# Patient Record
Sex: Female | Born: 1995 | Race: Black or African American | Hispanic: No | Marital: Single | Smoking: Never smoker
Health system: Southern US, Community
[De-identification: ages and names within clinical notes are randomized; demographics above are authoritative.]

## PROBLEM LIST (undated history)

## (undated) DIAGNOSIS — N649 Disorder of breast, unspecified: Secondary | ICD-10-CM

## (undated) HISTORY — PX: NO PAST SURGERIES: SHX2092

## (undated) HISTORY — PX: BREAST SURGERY: SHX581

## (undated) HISTORY — DX: Disorder of breast, unspecified: N64.9

---

## 2020-06-16 ENCOUNTER — Ambulatory Visit: Payer: Self-pay

## 2021-03-10 ENCOUNTER — Ambulatory Visit: Payer: Medicaid Other

## 2021-03-22 ENCOUNTER — Ambulatory Visit: Payer: Medicaid Other

## 2021-04-22 ENCOUNTER — Encounter: Payer: Self-pay | Admitting: Physician Assistant

## 2021-04-22 ENCOUNTER — Ambulatory Visit: Payer: Medicaid Other

## 2021-04-22 ENCOUNTER — Other Ambulatory Visit: Payer: Self-pay

## 2021-04-22 ENCOUNTER — Ambulatory Visit (LOCAL_COMMUNITY_HEALTH_CENTER): Payer: Medicaid Other | Admitting: Physician Assistant

## 2021-04-22 VITALS — BP 110/67 | Ht 66.0 in | Wt 219.4 lb

## 2021-04-22 DIAGNOSIS — Z01419 Encounter for gynecological examination (general) (routine) without abnormal findings: Secondary | ICD-10-CM

## 2021-04-22 DIAGNOSIS — Z3009 Encounter for other general counseling and advice on contraception: Secondary | ICD-10-CM

## 2021-04-22 DIAGNOSIS — N6312 Unspecified lump in the right breast, upper inner quadrant: Secondary | ICD-10-CM

## 2021-04-22 NOTE — Progress Notes (Addendum)
Here today for PE. Patient states last PE was 2-3 years ago in [place]. No records in Epic. Unsure if she has had a Pap Smear. Not interested in birth control. Hal Morales, RN

## 2021-04-22 NOTE — Progress Notes (Signed)
[facility] [place] Number: [phone]    Family Planning Visit- Initial Visit  Subjective:  Denise Gates is a 25 y.o.  [id number]   being seen today for an initial annual visit and to discuss contraceptive options.  The patient is currently using Abstinence for pregnancy prevention. Patient reports she does not want a pregnancy in the next year.  Patient has the following medical conditions has Breast lump on right side at 2 o'clock position on their problem list.  Chief Complaint  Patient presents with  . Gynecologic Exam    Patient reports R breast lump for at least 3-6 mo, had eval by urgent care 2-3 mo ago, but no imaging. Was told it might be fibrocytstic/hormonally-related. Wants reassessment today.   Patient denies h/o breast surgery, injury. Has never been sexually active, or used drugs. Rare remote alcohol use.  Body mass index is 35.41 kg/m. - Patient is eligible for diabetes screening based on BMI and age >76?  no HA1C ordered? not applicable  Patient reports 0  partner/s in last year. Desires STI screening?  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  No  No results found for: HCVAB Pt desires HCV screen.  Does the patient have current drug use (including MJ), have a partner with drug use, and/or has been incarcerated since last result? No  If yes-- Screen for HCV through [facility]   Does the patient meet criteria for HBV testing? No  Criteria:  -Household, sexual or needle sharing contact with HBV -History of drug use -HIV positive -Those with known Hep C   Health Maintenance Due  Topic Date Due  . COVID-19 Vaccine (1) Never done  . HPV VACCINES (1 - 2-dose series) Never done  . HIV Screening  Never done  . Hepatitis C Screening  Never done  . TETANUS/TDAP  Never  done  . PAP-Cervical Cytology Screening  Never done  . PAP SMEAR-Modifier  Never done    Review of Systems  Constitutional: Negative.   HENT: Negative.   Eyes: Negative.   Respiratory: Negative.   Cardiovascular: Negative.   Gastrointestinal: Negative.   Genitourinary: Negative.   Musculoskeletal: Negative.   Skin: Negative.   Neurological: Negative.   Endo/Heme/Allergies: Negative.   Psychiatric/Behavioral: Negative.   All other systems reviewed and are negative.   The following portions of the patient's history were reviewed and updated as appropriate: allergies, current medications, past family history, past medical history, past social history, past surgical history and problem list. Problem list updated.   See flowsheet for other program required questions.  Objective:   Vitals:   04/22/21 0910  BP: 110/67  Weight: 219 lb 6.4 oz (99.5 kg)  Height: 5\' 6"  (1.676 m)    Physical Exam Constitutional:      Appearance: Normal appearance. She is obese.  HENT:     Head: Normocephalic and atraumatic.  Pulmonary:     Effort: Pulmonary effort is normal.  Chest:     Chest wall: No mass, deformity, swelling or tenderness.  Breasts:     Tanner Score is 5.     Right: Mass present. No inverted nipple, nipple discharge, skin change, tenderness, axillary adenopathy or supraclavicular adenopathy.     Left: Normal. No inverted nipple, mass, nipple discharge, skin change, tenderness, axillary  adenopathy or supraclavicular adenopathy.      Comments: R breast mobile firm nontender 1x2cm lump at 2 o'clock Abdominal:     Palpations: Abdomen is soft.  Genitourinary:    General: Normal vulva.     Exam position: Lithotomy position.     Pubic Area: No rash.      Labia:        Right: No tenderness or lesion.        Left: No tenderness or lesion.      Vagina: No vaginal discharge, erythema or tenderness.     Cervix: No cervical motion tenderness, discharge, lesion or erythema.      Uterus: Not enlarged and not tender.      Adnexa:        Right: No tenderness or fullness.         Left: No tenderness or fullness.       Rectum: No external hemorrhoid.  Musculoskeletal:        General: Normal range of motion.  Lymphadenopathy:     Upper Body:     Right upper body: No supraclavicular, axillary or pectoral adenopathy.     Left upper body: No supraclavicular, axillary or pectoral adenopathy.     Lower Body: No right inguinal adenopathy. No left inguinal adenopathy.  Skin:    General: Skin is warm and dry.  Neurological:     General: No focal deficit present.     Mental Status: She is alert.  Psychiatric:        Mood and Affect: Mood normal.        Behavior: Behavior normal.       Assessment and Plan:  Denise Gates is a 25 y.o. female presenting to the [facility] Department for an initial annual wellness/contraceptive visit  Contraception counseling: Reviewed all forms of birth control options in the tiered based approach. available including abstinence; over the counter/barrier methods; hormonal contraceptive medication including pill, patch, ring, injection,contraceptive implant, ECP; hormonal and nonhormonal IUDs; permanent sterilization options including vasectomy and the various tubal sterilization modalities. Risks, benefits, and typical effectiveness rates were reviewed.  Questions were answered.  Written information was also given to the patient to review.  Patient desires no method. She will follow up in  12 mo for surveillance.  She was told to call with any further questions, or with any concerns about this method of contraception.  Emphasized use of condoms 100% of the time for STI prevention.   1. Family planning services Pt plans to continue abstinence.  2. Well woman exam with routine gynecological exam Discussed option for COCPs for noncontraceptive benefit to decrease menstrual pain, pt is considering. Enc pt to establish primary  care provider. - Syphilis Serology, [facility] - HIV/HCV [facility] - Pap IG (Image Guided)  3. Breast lump on right side at 2 o'clock position BCCCP referral, may need ultrasound for further eval.  4. BMI elevated at 35.4 Enc diet/exercise.  Return in about 1 year (around 04/22/2022) for Annual well-woman exam.  Future Appointments  Date Time Provider [place]  04/28/2021 12:30 PM [facility] CLINIC [facility] None    Lora Havens, PA-C

## 2021-04-23 ENCOUNTER — Encounter: Payer: Self-pay | Admitting: Family Medicine

## 2021-04-23 NOTE — Progress Notes (Signed)
Received referral to [facility] from A. Streilein PA-C as client with several month history of right breast lump in 2 o'clock position. Per Epic appt desk, client has previously scheduled [facility] appt for same reason as above on referral from [facility]. Ms. Ronette Deter aware of above and [facility] referral not faxed. Rich Number, RN

## 2021-04-28 ENCOUNTER — Ambulatory Visit
Admission: RE | Admit: 2021-04-28 | Discharge: 2021-04-28 | Disposition: A | Discharge status: Home / Self Care | Payer: Self-pay | Source: Ambulatory Visit | Attending: Oncology | Admitting: Oncology

## 2021-04-28 ENCOUNTER — Other Ambulatory Visit: Payer: Self-pay

## 2021-04-28 ENCOUNTER — Ambulatory Visit: Payer: Medicaid Other | Attending: Oncology

## 2021-04-28 VITALS — BP 123/70 | HR 51 | Temp 97.6°F | Ht 67.5 in | Wt 219.0 lb

## 2021-04-28 DIAGNOSIS — N63 Unspecified lump in unspecified breast: Secondary | ICD-10-CM

## 2021-04-28 LAB — PAP IG (IMAGE GUIDED): PAP Smear Comment: 0

## 2021-04-28 LAB — HM HIV SCREENING LAB: HM HIV Screening: NEGATIVE

## 2021-04-28 LAB — HM HEPATITIS C SCREENING LAB: HM Hepatitis Screen: NEGATIVE

## 2021-04-28 NOTE — Addendum Note (Signed)
Addended byTheodore Demark on: 04/28/2021 02:01 PM   Modules accepted: Orders

## 2021-04-28 NOTE — Progress Notes (Signed)
  Subjective:     Patient ID: Denise Gates, female   DOB: 05-15-1996, 25 y.o.   MRN: [id number]  HPI   [facility] Medical History Record - 04/28/21 1246      Breast History   Screening cycle New    CBE Date 04/20/21    Provider (CBE) [facility]    Last Mammogram Never    Recent Breast Symptoms Lump   present 5-6 months     Breast Cancer History   Breast Cancer History No personal or family history      Previous History of Breast Problems   Breast Surgery or Biopsy None    Breast Implants N/A    BSE Done Monthly      Gynecological/Obstetrical History   LMP 04/19/21    Is there any chance that the client could be pregnant?  No    Age at menarche 88    Age at menopause NA    PAP smear history Annually    Date of last PAP  04/22/21    Provider (PAP) [facility]    DES Exposure Unkown    Cervical, Uterine or Ovarian cancer No    Family history of Cervial, Uterine or Ovarian cancer No    Hysterectomy No    Cervix removed No    Ovaries removed No    Laser/Cryosurgery No    Current method of birth control None    Current method of Estrogen/Hormone replacement None    Smoking history None             Review of Systems     Objective:   Physical Exam Chest:  Breasts:     Right: Mass present. No swelling, bleeding, inverted nipple, nipple discharge, skin change or tenderness.     Left: No swelling, bleeding, inverted nipple, mass, nipple discharge, skin change or tenderness.           Assessment:     25 year old patient presents for [facility] clinic visit. Reports she has a right breast lump x 1 year.  States she was seen in an Urgent care, last Spring and told she had fibrocystic breasts.   Patient screened, and meets BCCCP eligibility.  Patient does not have insurance, Medicare or Medicaid. Instructed patient on breast self awareness using teach back method.  Clinical breast exam reveals a firm mobile 2 cm mass at 2 o'clock right breast.  Mass is also  visible on visual inspection.  Explained BCCCP guidelines to patient.  Will follow per radiologist recommendation, and BCCCP guidelines.     Plan:     Sent for left breast ultrasound.

## 2021-04-29 ENCOUNTER — Other Ambulatory Visit: Payer: Self-pay | Admitting: *Deleted

## 2021-04-29 DIAGNOSIS — N63 Unspecified lump in unspecified breast: Secondary | ICD-10-CM

## 2021-05-11 ENCOUNTER — Ambulatory Visit: Payer: Medicaid Other

## 2021-05-19 ENCOUNTER — Ambulatory Visit
Admission: RE | Admit: 2021-05-19 | Discharge: 2021-05-19 | Disposition: A | Discharge status: Home / Self Care | Payer: Self-pay | Source: Ambulatory Visit | Attending: Oncology | Admitting: Oncology

## 2021-05-19 ENCOUNTER — Other Ambulatory Visit: Payer: Self-pay

## 2021-05-19 DIAGNOSIS — N63 Unspecified lump in unspecified breast: Secondary | ICD-10-CM

## 2021-05-20 LAB — SURGICAL PATHOLOGY

## 2021-05-20 NOTE — Progress Notes (Signed)
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  Radiologist recommended surgical consult to discuss excision with patient.  Explained to patient that BCCCP will pay for consult, but may not cover surgery entirely.

## 2021-05-25 ENCOUNTER — Other Ambulatory Visit: Payer: Self-pay

## 2021-05-25 ENCOUNTER — Ambulatory Visit (INDEPENDENT_AMBULATORY_CARE_PROVIDER_SITE_OTHER): Payer: Self-pay | Admitting: General Surgery

## 2021-05-25 ENCOUNTER — Encounter: Payer: Self-pay | Admitting: General Surgery

## 2021-05-25 VITALS — BP 110/73 | HR 83 | Temp 99.1°F | Ht 67.0 in | Wt 218.0 lb

## 2021-05-25 DIAGNOSIS — N6312 Unspecified lump in the right breast, upper inner quadrant: Secondary | ICD-10-CM

## 2021-05-25 NOTE — Progress Notes (Signed)
Patient ID: Denise Gates, female   DOB: [birth year], 25 y.o.   MRN: [id number]  Chief Complaint  Patient presents with   New Patient (Initial Visit)    Right breast mass    HPI Denise Gates is a 25 y.o. female.   About 1 year ago, she self diagnosed a lump in her right breast.  She went to an urgent care and was told it was likely just fibrocystic change or hormonally related.  It seemed to get bigger over the subsequent months and she ultimately made arrangements for imaging.  This was done on April 28, 2021.  Recommendation was made for biopsy, which was then performed on June 22.  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  [name] reports menarche at the age of 51.  No pregnancies and no use of birth control or hormonal therapy.  She denies any skin changes or nipple discharge.  She does perform monthly breast exams, which is how she identified to the lump.  No family history of breast cancer.   Past Medical History:  Diagnosis Date   Breast disorder    lump on right breast    Past Surgical History:  Procedure Laterality Date   NO PAST SURGERIES      Family History  Problem Relation Age of Onset   Endometriosis Mother     Social History Social History   Tobacco Use   Smoking status: Never   Smokeless tobacco: Never  Substance Use Topics   Alcohol use: Never   Drug use: Never    No Known Allergies  No current outpatient medications on file.   No current facility-administered medications for this visit.    Review of Systems Review of Systems  All other systems reviewed and are negative.  Blood pressure 110/73, pulse 83, temperature 99.1 F (37.3 C), temperature source Oral, height 5\' 7"  (1.702 m), weight 218 lb (98.9 kg), last menstrual period 04/29/2021, SpO2 98 %. Body mass index is 34.14 kg/m.  Physical Exam Physical Exam Vitals reviewed. Exam  conducted with a chaperone present.  Constitutional:      General: She is not in acute distress.    Appearance: Normal appearance. She is obese.  HENT:     Head: Normocephalic and atraumatic.     Nose:     Comments: Covered with a mask    Mouth/Throat:     Comments: Covered with a mask Eyes:     General: No scleral icterus.       Right eye: No discharge.        Left eye: No discharge.  Neck:     Comments: No palpable cervical or supraclavicular lymphadenopathy.  The trachea is midline.  No thyromegaly or dominant thyroid masses appreciated.  The gland moves freely with deglutition. Cardiovascular:     Rate and Rhythm: Normal rate and regular rhythm.     Pulses: Normal pulses.  Pulmonary:     Effort: Pulmonary effort is normal.     Breath sounds: Normal breath sounds.  Chest:       Comments: Palpable 3 cm mass at 2:00 near the chest wall.  It is well-circumscribed and mobile.  There is a smaller mass just medial and deep to it, consistent with the imaging that has already been performed. Abdominal:     General: Bowel sounds are normal.     Palpations: Abdomen is soft.  Genitourinary:  Comments: Deferred Musculoskeletal:        General: No swelling or deformity.  Skin:    General: Skin is warm and dry.  Neurological:     General: No focal deficit present.     Mental Status: She is alert and oriented to person, place, and time.  Psychiatric:        Mood and Affect: Mood normal.        Behavior: Behavior normal.    Data Reviewed Imaging and pathology reviewed.  I concur with radiology interpretations.  CLINICAL DATA:  Palpable mass in the RIGHT breast   EXAM: ULTRASOUND OF THE RIGHT BREAST   COMPARISON:  None.   FINDINGS: On physical exam, there is a hard mass in the upper inner breast.   Targeted ultrasound was performed of the site of palpable concern. At the site of palpable concern at 2 o'clock 7 cm from the nipple, there is an oval hypoechoic mass with  angular margins. It measures approximately 3.1 by 2.9 x 1.6 cm. Immediately adjacent to this mass, there is an additional oval circumscribed hypoechoic mass. It measures 11 x 8 by 9 mm.   Targeted ultrasound was performed of the RIGHT axilla. No suspicious axillary lymph nodes are seen.   IMPRESSION: 1. At the site of palpable concern in the RIGHT upper inner breast, there is a 3.1 cm mass which is indeterminate. Recommend ultrasound-guided biopsy for definitive characterization. Even if there are benign biopsy results, surgical referral should be considered given mass size. 2. There is an immediately adjacent 11 mm probably benign mass. If there are benign biopsy results, recommend follow-up ultrasound in 6 months. If surgical excision is pursued, this mass could be excised at time of surgery given close proximity to dominant mass. 3. No RIGHT axillary adenopathy.   RECOMMENDATION: RIGHT breast ultrasound-guided biopsy x1   I have discussed the findings and recommendations with the patient. If applicable, a reminder letter will be sent to the patient regarding the next appointment. Patient will be scheduled for biopsy at her earliest convenience by the schedulers and ordering provider will be notified.   BI-RADS CATEGORY  4: Suspicious.   ADDENDUM REPORT: 05/20/2021 11:45   ADDENDUM: PATHOLOGY revealed: A. RIGHT BREAST, 2:00 o'clock, 7 CMFN; ULTRASOUND-GUIDED BIOPSY: - FIBROADENOMA. - NEGATIVE FOR ATYPIA AND MALIGNANCY.   Pathology results are CONCORDANT with imaging findings, per Dr. Valentino Saxon.   Pathology results and recommendations were discussed with patient via telephone on 05/20/2021. Patient reported doing well after the biopsy with no adverse symptoms, and only slight tenderness at the site. Post biopsy care instructions were reviewed, questions were answered and my direct phone number was provided. Patient was instructed to call [facility]  for any additional questions or concerns related to biopsy site.   Recommend surgical consultation for consideration of excision due to size of mass and patient preference. If surgical intervention, recommend immediately adjacent mass be excised at time of surgery. Request for surgical consultation relayed to Al Pimple RN and Tanya Nones RN at [facility] by Electa Sniff RN on 05/20/2021.   If surgical excision is not performed, then recommend follow up US in 6 months of the immediately adjacent probably benign mass which likely reflects an additional fibroadenoma.   Pathology results reported by Electa Sniff RN on 05/20/2021.  Assessment This is a 25 year old woman with a right breast mass that she self identified.  There is a second mass just adjacent to it with nearly identical  sonographic characteristics.  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  She is interested in surgical removal.  Plan I have offered her excision of 2 right breast masses.  The risk the operation were discussed with her.  These include, but are not limited to, bleeding, infection, hematoma, seroma, recurrence, unanticipated pathology necessitating additional interventions or treatments.  She just started a new job and does want to defer for a few weeks prior to having her operation.  We will make the arrangements and work around her preferred timetable.    Fredirick Maudlin 05/25/2021, 3:43 PM

## 2021-05-25 NOTE — Patient Instructions (Addendum)
Our surgery scheduler will call within 24-48 hours to schedule your surgery. Please have the [facility] surgery sheet available when speaking with her.   Fibroadenoma  A fibroadenoma is a lump (tumor) in the breast. The lump is benign. This means that it is not cancer. It may move under your skin when you touch it. This kind of lump can grow in [place] or in both breasts. What are the causes? The cause of this condition is not known. What increases the risk? Being a woman between the ages of 22 and 59. Being a woman of African American descent. What are the signs or symptoms? Some lumps are too small to be felt. If you can feel it, it may feel like a lump that is: Firm. Round. Smooth. Able to move around a bit. How is this treated? Regular breast exams are done to check for changes in the lump. In some cases, the lump may be removed if: It is large. It keeps growing. It causes pain or changes in the skin of the breast. A young girl has a lump. Lumps in young girls tend to grow over time. Follow these instructions at home: Breast exams Check your breasts at home as told by your doctor. Report any changes or concerns. Check for the following: The size of the lump. The look and feel of the skin of your breasts. The look and feel of your nipples.  General instructions Do not smoke or use any products that contain nicotine or tobacco. These can further increase your cancer risk. If you need help quitting, ask your doctor. Keep all follow-up visits. You will need breast exams on a regular basis. Contact a doctor if: The lump changes in size or feels different. The lump starts to be painful. You find a new lump. You have any changes in how the skin on your breast looks. You have any changes in your nipple, such as: Fluid leaking from your nipple. Redness around your nipple. Summary A fibroadenoma is a lump (tumor) in the breast. The lump is benign. This means that it is not  cancer. This may feel firm, round, or smooth, and it may move around a bit when touched. Some lumps are too small to be felt. Do breast exams at home. Watch for changes in the size of the lump. Contact your doctor if the lump grows bigger or starts to cause pain. Also, let your doctor know if you have any changes in your nipple or in how the skin on your breast looks. This information is not intended to replace advice given to you by your health care provider. Make sure you discuss any questions you have with your healthcare provider. Document Revised: 09/14/2020 Document Reviewed: 09/14/2020 Elsevier Patient Education  2022 [employer].

## 2021-05-26 ENCOUNTER — Telehealth: Payer: Self-pay | Admitting: General Surgery

## 2021-05-26 NOTE — Telephone Encounter (Signed)
Patient has been advised of Pre-Admission date/time, COVID Testing date and Surgery date.  Surgery Date: 06/21/21 Preadmission Testing Date: 06/11/21 (phone 1p-5p) Covid Testing Date: Not needed.     Patient has been made aware to call [phone], between 1-3:00pm the day before surgery, to find out what time to arrive for surgery.

## 2021-06-01 NOTE — Progress Notes (Signed)
Untriaged pap smear, found on quality assurance checks  NIL, next pap in 3 years.

## 2021-06-10 ENCOUNTER — Encounter: Payer: Self-pay | Admitting: Nurse Practitioner

## 2021-06-10 NOTE — Progress Notes (Signed)
PAP normal, HPV not done.  Repeat PAP in 3 years (03/2024) per Lauretta Chester, MD. PAP card mailed today.  MyCHART account active, message sent to patient.  Gregary Cromer, RN

## 2021-06-11 ENCOUNTER — Inpatient Hospital Stay: Admission: RE | Admit: 2021-06-11 | Payer: Self-pay | Source: Ambulatory Visit

## 2021-06-16 ENCOUNTER — Telehealth: Payer: Self-pay | Admitting: General Surgery

## 2021-06-16 ENCOUNTER — Inpatient Hospital Stay: Admission: RE | Admit: 2021-06-16 | Payer: Medicaid Other | Source: Ambulatory Visit

## 2021-06-16 NOTE — Progress Notes (Signed)
Copy to [facility].

## 2021-06-16 NOTE — Telephone Encounter (Signed)
Updated information regarding rescheduled surgery at patient's request.    Patient has been advised of Pre-Admission date/time, COVID Testing date and Surgery date.  Surgery Date: 07/26/21 Preadmission Testing Date: 07/19/21 (phone 8a-1p) Covid Testing Date: Not needed.     Patient has been made aware to call [phone], between 1-3:00pm the day before surgery, to find out what time to arrive for surgery.

## 2021-07-19 ENCOUNTER — Other Ambulatory Visit: Payer: Self-pay

## 2021-07-19 ENCOUNTER — Encounter
Admission: RE | Admit: 2021-07-19 | Discharge: 2021-07-19 | Disposition: A | Discharge status: Home / Self Care | Payer: Medicaid Other | Source: Ambulatory Visit | Attending: General Surgery | Admitting: General Surgery

## 2021-07-19 NOTE — Patient Instructions (Signed)
Your procedure is scheduled on: 07/26/21 Report to [facility]. To find out your arrival time please call [phone] between 1PM - 3PM on 07/23/21.  Remember: Instructions that are not followed completely may result in serious medical risk, up to and including death, or upon the discretion of your surgeon and anesthesiologist your surgery may need to be rescheduled.     _X__ 1. Do not eat food or drink any liquids after midnight the night before your procedure.                 No gum chewing or hard candies.   __X__2.  On the morning of surgery brush your teeth with toothpaste and water, you                 may rinse your mouth with mouthwash if you wish.  Do not swallow any              toothpaste of mouthwash.     _X__ 3.  No Alcohol for 24 hours before or after surgery.   _X__ 4.  Do Not Smoke or use e-cigarettes For 24 Hours Prior to Your Surgery.                 Do not use any chewable tobacco products for at least 6 hours prior to                 surgery.  ____  5.  Bring all medications with you on the day of surgery if instructed.   __X__  6.  Notify your doctor if there is any change in your medical condition      (cold, fever, infections).     Do not wear jewelry, make-up, hairpins, clips or nail polish. Do not wear lotions, powders, or perfumes. No Doedorant Do not shave body hair 48 hours prior to surgery. Men may shave face and neck. Do not bring valuables to the hospital.    [facility] is not responsible for any belongings or valuables.  Contacts, dentures/partials or body piercings may not be worn into surgery. Bring a case for your contacts, glasses or hearing aids, a denture cup will be supplied. Leave your suitcase in the car. After surgery it may be brought to your room. (Admissions Only) For patients admitted to the hospital, discharge time is determined by your treatment team.   Patients discharged the day  of surgery will not be allowed to drive home.   Please read over the following fact sheets that you were given:     __X__ Take these medicines the morning of surgery with A SIP OF WATER:    1. none  2.   3.   4.  5.  6.  ____ Fleet Enema (as directed)   __X__ Use CHG Soap/SAGE wipes as directed You may come to our office and pick up this soap or you may use DIAL (orange bar). Shower the night before and the morning of surgery  ____ Use inhalers on the day of surgery  ____ Stop metformin/Janumet/Farxiga 2 days prior to surgery    ____ Take 1/2 of usual insulin dose the night before surgery. No insulin the morning          of surgery.   ____ Stop Blood Thinners Coumadin/Plavix/Xarelto/Pleta/Pradaxa/Eliquis/Effient/Aspirin  on   Or contact your Surgeon, Cardiologist or Medical Doctor regarding  ability to stop your blood thinners  __X__ Stop Anti-inflammatories  7 days before surgery such as Advil, Ibuprofen, Motrin,  BC or Goodies Powder, Naprosyn, Naproxen, Aleve, Aspirin    __X__ Stop all herbal supplements, fish oil or vitamin E until after surgery.    ____ Bring C-Pap to the hospital.

## 2021-07-26 ENCOUNTER — Ambulatory Visit: Payer: Self-pay | Admitting: Anesthesiology

## 2021-07-26 ENCOUNTER — Encounter
Admission: RE | Disposition: A | Discharge status: Home / Self Care | Payer: Self-pay | Source: Home / Self Care | Attending: General Surgery

## 2021-07-26 ENCOUNTER — Encounter: Payer: Self-pay | Admitting: General Surgery

## 2021-07-26 ENCOUNTER — Ambulatory Visit
Admission: RE | Admit: 2021-07-26 | Discharge: 2021-07-26 | Disposition: A | Discharge status: Home / Self Care | Payer: Self-pay | Attending: General Surgery | Admitting: General Surgery

## 2021-07-26 DIAGNOSIS — D241 Benign neoplasm of right breast: Secondary | ICD-10-CM | POA: Insufficient documentation

## 2021-07-26 DIAGNOSIS — N6312 Unspecified lump in the right breast, upper inner quadrant: Secondary | ICD-10-CM

## 2021-07-26 HISTORY — PX: BREAST CYST EXCISION: SHX579

## 2021-07-26 LAB — POCT PREGNANCY, URINE: Preg Test, Ur: NEGATIVE

## 2021-07-26 SURGERY — EXCISION, CYST, BREAST
Anesthesia: General | Laterality: Right

## 2021-07-26 MED ORDER — MIDAZOLAM HCL 2 MG/2ML IJ SOLN
INTRAMUSCULAR | Status: DC | PRN
  Administered 2021-07-26: 2 mg via INTRAVENOUS

## 2021-07-26 MED ORDER — HYDROCODONE-ACETAMINOPHEN 5-325 MG PO TABS: 1.0000 | ORAL_TABLET | Freq: Four times a day (QID) | ORAL | 0 refills | Status: DC | PRN

## 2021-07-26 MED ORDER — LIDOCAINE-EPINEPHRINE 1 %-1:100000 IJ SOLN
INTRAMUSCULAR | Status: DC | PRN
  Administered 2021-07-26: 5 mL

## 2021-07-26 MED ORDER — DEXAMETHASONE SODIUM PHOSPHATE 10 MG/ML IJ SOLN
INTRAMUSCULAR | Status: DC | PRN
  Administered 2021-07-26: 10 mg via INTRAVENOUS

## 2021-07-26 MED ORDER — MIDAZOLAM HCL 2 MG/2ML IJ SOLN
INTRAMUSCULAR | Status: AC
  Filled 2021-07-26: qty 2

## 2021-07-26 MED ORDER — CHLORHEXIDINE GLUCONATE CLOTH 2 % EX PADS: 6.0000 | MEDICATED_PAD | Freq: Once | CUTANEOUS | Status: DC

## 2021-07-26 MED ORDER — ORAL CARE MOUTH RINSE: 15.0000 mL | Freq: Once | OROMUCOSAL | Status: DC

## 2021-07-26 MED ORDER — FENTANYL CITRATE (PF) 100 MCG/2ML IJ SOLN
INTRAMUSCULAR | Status: AC
  Filled 2021-07-26: qty 2

## 2021-07-26 MED ORDER — IBUPROFEN 800 MG PO TABS: 800.0000 mg | ORAL_TABLET | Freq: Three times a day (TID) | ORAL | 0 refills | Status: DC | PRN

## 2021-07-26 MED ORDER — OXYCODONE HCL 5 MG/5ML PO SOLN
5.0000 mg | Freq: Once | ORAL | Status: DC | PRN
Start: 2021-07-26 — End: 2021-07-26

## 2021-07-26 MED ORDER — PROMETHAZINE HCL 25 MG/ML IJ SOLN: 6.2500 mg | INTRAMUSCULAR | Status: DC | PRN

## 2021-07-26 MED ORDER — GABAPENTIN 300 MG PO CAPS
ORAL_CAPSULE | ORAL | Status: AC
  Administered 2021-07-26: 300 mg via ORAL
  Filled 2021-07-26: qty 1

## 2021-07-26 MED ORDER — PROPOFOL 10 MG/ML IV BOLUS
INTRAVENOUS | Status: DC | PRN
  Administered 2021-07-26: 200 mg via INTRAVENOUS

## 2021-07-26 MED ORDER — FAMOTIDINE 20 MG PO TABS: 20.0000 mg | ORAL_TABLET | Freq: Once | ORAL | Status: AC

## 2021-07-26 MED ORDER — GABAPENTIN 300 MG PO CAPS: 300.0000 mg | ORAL_CAPSULE | ORAL | Status: AC

## 2021-07-26 MED ORDER — FENTANYL CITRATE (PF) 100 MCG/2ML IJ SOLN
INTRAMUSCULAR | Status: DC | PRN
  Administered 2021-07-26: 50 ug via INTRAVENOUS

## 2021-07-26 MED ORDER — CELECOXIB 200 MG PO CAPS: 200.0000 mg | ORAL_CAPSULE | ORAL | Status: AC

## 2021-07-26 MED ORDER — CELECOXIB 200 MG PO CAPS
ORAL_CAPSULE | ORAL | Status: AC
  Administered 2021-07-26: 200 mg via ORAL
  Filled 2021-07-26: qty 1

## 2021-07-26 MED ORDER — OXYCODONE HCL 5 MG PO TABS: 5.0000 mg | ORAL_TABLET | Freq: Once | ORAL | Status: DC | PRN

## 2021-07-26 MED ORDER — ACETAMINOPHEN 500 MG PO TABS
ORAL_TABLET | ORAL | Status: AC
  Administered 2021-07-26: 1000 mg via ORAL
  Filled 2021-07-26: qty 2

## 2021-07-26 MED ORDER — MEPERIDINE HCL 25 MG/ML IJ SOLN: 6.2500 mg | INTRAMUSCULAR | Status: DC | PRN

## 2021-07-26 MED ORDER — ACETAMINOPHEN 500 MG PO TABS: 1000.0000 mg | ORAL_TABLET | ORAL | Status: AC

## 2021-07-26 MED ORDER — CHLORHEXIDINE GLUCONATE 0.12 % MT SOLN
OROMUCOSAL | Status: AC
  Filled 2021-07-26: qty 15

## 2021-07-26 MED ORDER — LACTATED RINGERS IV SOLN: INTRAVENOUS | Status: DC

## 2021-07-26 MED ORDER — CELECOXIB 200 MG PO CAPS
ORAL_CAPSULE | ORAL | Status: AC
  Filled 2021-07-26: qty 1

## 2021-07-26 MED ORDER — BUPIVACAINE HCL (PF) 0.25 % IJ SOLN
INTRAMUSCULAR | Status: AC
  Filled 2021-07-26: qty 30

## 2021-07-26 MED ORDER — LIDOCAINE HCL (CARDIAC) PF 100 MG/5ML IV SOSY
PREFILLED_SYRINGE | INTRAVENOUS | Status: DC | PRN
  Administered 2021-07-26: 100 mg via INTRAVENOUS

## 2021-07-26 MED ORDER — FENTANYL CITRATE (PF) 100 MCG/2ML IJ SOLN: 25.0000 ug | INTRAMUSCULAR | Status: DC | PRN

## 2021-07-26 MED ORDER — FAMOTIDINE 20 MG PO TABS
ORAL_TABLET | ORAL | Status: AC
  Administered 2021-07-26: 20 mg via ORAL
  Filled 2021-07-26: qty 1

## 2021-07-26 MED ORDER — CEFAZOLIN SODIUM-DEXTROSE 2-4 GM/100ML-% IV SOLN
2.0000 g | INTRAVENOUS | Status: AC
  Administered 2021-07-26: 2 g via INTRAVENOUS

## 2021-07-26 MED ORDER — ONDANSETRON HCL 4 MG/2ML IJ SOLN
INTRAMUSCULAR | Status: DC | PRN
  Administered 2021-07-26: 4 mg via INTRAVENOUS

## 2021-07-26 MED ORDER — LIDOCAINE-EPINEPHRINE 1 %-1:100000 IJ SOLN
INTRAMUSCULAR | Status: AC
  Filled 2021-07-26: qty 1

## 2021-07-26 MED ORDER — DEXMEDETOMIDINE (PRECEDEX) IN NS 20 MCG/5ML (4 MCG/ML) IV SYRINGE
PREFILLED_SYRINGE | INTRAVENOUS | Status: DC | PRN
  Administered 2021-07-26 (×3): 4 ug via INTRAVENOUS

## 2021-07-26 MED ORDER — CEFAZOLIN SODIUM-DEXTROSE 2-4 GM/100ML-% IV SOLN
INTRAVENOUS | Status: AC
  Filled 2021-07-26: qty 100

## 2021-07-26 MED ORDER — CHLORHEXIDINE GLUCONATE 0.12 % MT SOLN: 15.0000 mL | Freq: Once | OROMUCOSAL | Status: DC

## 2021-07-26 SURGICAL SUPPLY — 47 items
ADH SKN CLS APL DERMABOND .7 (GAUZE/BANDAGES/DRESSINGS) ×1
APL PRP STRL LF DISP 70% ISPRP (MISCELLANEOUS) ×1
BINDER BREAST XLRG (GAUZE/BANDAGES/DRESSINGS) ×2 IMPLANT
BLADE SURG 15 STRL LF DISP TIS (BLADE) ×2 IMPLANT
BLADE SURG 15 STRL SS (BLADE) ×4
CANISTER SUCT 1200ML W/VALVE (MISCELLANEOUS) IMPLANT
CHLORAPREP W/TINT 26 (MISCELLANEOUS) ×2 IMPLANT
CLIP VESOCCLUDE SM WIDE 6/CT (CLIP) ×2 IMPLANT
CNTNR SPEC 2.5X3XGRAD LEK (MISCELLANEOUS)
CONT SPEC 4OZ STER OR WHT (MISCELLANEOUS)
CONT SPEC 4OZ STRL OR WHT (MISCELLANEOUS)
CONTAINER SPEC 2.5X3XGRAD LEK (MISCELLANEOUS) IMPLANT
DERMABOND ADVANCED (GAUZE/BANDAGES/DRESSINGS) ×1
DERMABOND ADVANCED .7 DNX12 (GAUZE/BANDAGES/DRESSINGS) ×1 IMPLANT
DEVICE DSSCT PLSMBLD 3.0S LGHT (MISCELLANEOUS) ×1 IMPLANT
DRAPE LAPAROTOMY 77X122 PED (DRAPES) ×2 IMPLANT
DRSG GAUZE FLUFF 36X18 (GAUZE/BANDAGES/DRESSINGS) ×2 IMPLANT
ELECT CAUTERY BLADE TIP 2.5 (TIP) ×2
ELECT REM PT RETURN 9FT ADLT (ELECTROSURGICAL) ×2
ELECTRODE CAUTERY BLDE TIP 2.5 (TIP) ×1 IMPLANT
ELECTRODE REM PT RTRN 9FT ADLT (ELECTROSURGICAL) ×1 IMPLANT
GAUZE 4X4 16PLY ~~LOC~~+RFID DBL (SPONGE) ×2 IMPLANT
GLOVE SURG SYN 6.5 ES PF (GLOVE) ×4 IMPLANT
GLOVE SURG UNDER LTX SZ7 (GLOVE) ×4 IMPLANT
GOWN STRL REUS W/ TWL LRG LVL3 (GOWN DISPOSABLE) ×2 IMPLANT
GOWN STRL REUS W/TWL LRG LVL3 (GOWN DISPOSABLE) ×4
IV SODIUM CHL 0.9% 500ML (IV SOLUTION) ×2 IMPLANT
KIT MARKER MARGIN INK (KITS) IMPLANT
KIT TURNOVER KIT A (KITS) ×2 IMPLANT
LABEL OR SOLS (LABEL) ×2 IMPLANT
MANIFOLD NEPTUNE II (INSTRUMENTS) ×2 IMPLANT
MARKER MARGIN CORRECT CLIP (MARKER) IMPLANT
NEEDLE HYPO 25X1 1.5 SAFETY (NEEDLE) ×2 IMPLANT
PACK BASIN MINOR ARMC (MISCELLANEOUS) ×2 IMPLANT
PLASMABLADE 3.0S W/LIGHT (MISCELLANEOUS) ×2
STRIP CLOSURE SKIN 1/2X4 (GAUZE/BANDAGES/DRESSINGS) ×2 IMPLANT
SUT MNCRL 4-0 (SUTURE) ×2
SUT MNCRL 4-0 27XMFL (SUTURE) ×1
SUT SILK 2 0 SH (SUTURE) ×2 IMPLANT
SUT VIC AB 3-0 SH 27 (SUTURE) ×2
SUT VIC AB 3-0 SH 27X BRD (SUTURE) ×1 IMPLANT
SUTURE MNCRL 4-0 27XMF (SUTURE) ×1 IMPLANT
SYR 10ML LL (SYRINGE) ×2 IMPLANT
SYR BULB IRRIG 60ML STRL (SYRINGE) ×2 IMPLANT
TUBING CONNECTING 10 (TUBING) ×2 IMPLANT
WATER STERILE IRR 1000ML POUR (IV SOLUTION) IMPLANT
WATER STERILE IRR 500ML POUR (IV SOLUTION) IMPLANT

## 2021-07-26 NOTE — Transfer of Care (Signed)
Immediate Anesthesia Transfer of Care Note  Patient: Denise Gates  Procedure(s) Performed: CYST EXCISION BREAST, excision of breast fibroadenoma x 2 (Right)  Patient Location: PACU  Anesthesia Type:General  Level of Consciousness: drowsy  Airway & Oxygen Therapy: Patient Spontanous Breathing and Patient connected to face mask oxygen  Post-op Assessment: Report given to RN and Post -op Vital signs reviewed and stable  Post vital signs: Reviewed and stable  Last Vitals:  Vitals Value Taken Time  BP 109/72 07/26/21 0823  Temp    Pulse 55 07/26/21 0826  Resp 13 07/26/21 0826  SpO2 100 % 07/26/21 0826  Vitals shown include unvalidated device data.  Last Pain:  Vitals:   07/26/21 0618  PainSc: 0-No pain         Complications: No notable events documented.

## 2021-07-26 NOTE — Anesthesia Preprocedure Evaluation (Signed)
Anesthesia Evaluation  Patient identified by MRN, date of birth, ID band Patient awake    Reviewed: Allergy & Precautions, NPO status , Patient's Chart, lab work & pertinent test results  History of Anesthesia Complications Negative for: history of anesthetic complications  Airway Mallampati: II  TM Distance: >3 FB Neck ROM: Full    Dental no notable dental hx.    Pulmonary neg pulmonary ROS, neg sleep apnea, neg COPD,    breath sounds clear to auscultation- rhonchi (-) wheezing      Cardiovascular Exercise Tolerance: Good (-) hypertension(-) CAD and (-) Past MI  Rhythm:Regular Rate:Normal - Systolic murmurs and - Diastolic murmurs    Neuro/Psych negative neurological ROS  negative psych ROS   GI/Hepatic negative GI ROS, Neg liver ROS,   Endo/Other  negative endocrine ROSneg diabetes  Renal/GU negative Renal ROS     Musculoskeletal negative musculoskeletal ROS (+)   Abdominal (+) + obese,   Peds  Hematology negative hematology ROS (+)   Anesthesia Other Findings   Reproductive/Obstetrics                             Anesthesia Physical Anesthesia Plan  ASA: 2  Anesthesia Plan: General   Post-op Pain Management:    Induction: Intravenous  PONV Risk Score and Plan: 2  Airway Management Planned: LMA  Additional Equipment:   Intra-op Plan:   Post-operative Plan:   Informed Consent: I have reviewed the patients History and Physical, chart, labs and discussed the procedure including the risks, benefits and alternatives for the proposed anesthesia with the patient or authorized representative who has indicated his/her understanding and acceptance.     Dental advisory given  Plan Discussed with: CRNA and Anesthesiologist  Anesthesia Plan Comments:         Anesthesia Quick Evaluation

## 2021-07-26 NOTE — Op Note (Signed)
Operative Note  Preoperative Diagnosis: Right breast fibroadenoma x2  Postoperative Diagnosis: Same  Operation: Excision of right breast fibroadenoma x2  Surgeon: Fredirick Maudlin, MD  Assistant: None  Anesthesia: General via laryngeal mask airway  Findings: 2 white fibrous masses in the upper inner quadrant of the right breast, consistent with preoperative imaging and biopsy indicative of fibroadenoma  Indications: This is a 25 year old woman who self identified a breast lump.  Subsequent imaging and biopsy of the larger mass was consistent with fibroadenoma.  There was a second, smaller mass adjacent to the larger mass that had similar imaging characteristics.  Due to the size of the larger mass, excision was recommended.  Due to the proximity of the smaller mass, it was felt that excision could be performed of both masses under the same anesthetic.  The risks of the operation were discussed with the patient and she agreed to proceed.  Procedure In Detail: The patient was identified in the preoperative holding area where she was properly marked.  She was then brought to the operating room and placed supine on the OR table.  Bony prominences were padded and bilateral sequential compression devices were placed on the lower extremities.  General anesthesia via LMA was induced without complications.  Perioperative antibiotics were administered.  The patient was prepped and draped in standard fashion and positioned appropriately for the operation.  A timeout is performed confirming her identity, the procedure being performed, her allergies, all necessary equipment was available, and that maintenance anesthesia was adequate.  A one-to-one mixture of 0.25% bupivacaine and 1% lidocaine with epinephrine was infiltrated into the skin and subcutaneous tissues surrounding the mass.  A 3 cm curvilinear incision was made overlying the larger of the masses.  This was carried down through the subcutaneous  tissues using electrocautery.  The larger mass was circumferentially dissected away from the surrounding breast tissue.  It was grasped with an Allis clamp and elevated.  As I came around the posterior aspect of the fibroadenoma, the second, smaller lesion was appreciated.  It was included in the same block of tissue as the larger lesion.  Both masses were completely excised and handed off as specimens.  The wound was irrigated and hemostasis confirmed.  The wound was then closed in several layers, using 3-0 Vicryl to close the potential space as well as in the dermal layer.  The skin was closed with running subcuticular Monocryl.  The skin was cleaned.  Dermabond and Steri-Strips were applied.  Gauze fluffs and a breast binder were used as a dressing.  The patient was awakened, the LMA removed, and she was taken to the postanesthesia care unit in good condition.  EBL: Less than 2 cc  IVF: See anesthesia record  Specimen(s): Right breast fibroadenoma x2  Complications: none immediately apparent.   Counts: all needles, instruments, and sponges were counted and reported to be correct in number at the end of the case.   I was present for and participated in the entire operation.  Fredirick Maudlin 8:32 AM

## 2021-07-26 NOTE — H&P (Signed)
Chief Complaint  Patient presents with   New Patient (Initial Visit)      Right breast mass      HPI Denise Gates is a 25 y.o. female.   About 1 year ago, she self diagnosed a lump in her right breast.  She went to an urgent care and was told it was likely just fibrocystic change or hormonally related.  It seemed to get bigger over the subsequent months and she ultimately made arrangements for imaging.  This was done on April 28, 2021.  Recommendation was made for biopsy, which was then performed on June 22.  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  Denise Gates reports menarche at the age of 46.  No pregnancies and no use of birth control or hormonal therapy.  She denies any skin changes or nipple discharge.  She does perform monthly breast exams, which is how she identified to the lump.  No family history of breast cancer.         Past Medical History:  Diagnosis Date   Breast disorder      lump on right breast           Past Surgical History:  Procedure Laterality Date   NO PAST SURGERIES               Family History  Problem Relation Age of Onset   Endometriosis Mother        Social History Social History        Tobacco Use   Smoking status: Never   Smokeless tobacco: Never  Substance Use Topics   Alcohol use: Never   Drug use: Never      No Known Allergies   No current outpatient medications on file.    No current facility-administered medications for this visit.      Review of Systems Review of Systems  All other systems reviewed and are negative.   Today's Vitals   07/26/21 0618  BP: 109/61  Pulse: 72  Resp: 16  SpO2: 99%  Weight: 97.1 kg  Height: '5\' 7"'$  (1.702 m)  PainSc: 0-No pain   Body mass index is 33.52 kg/m.    Physical Exam Physical Exam Vitals reviewed. Exam conducted with a chaperone present.  Constitutional:      General: She is  not in acute distress.    Appearance: Normal appearance. She is obese.  HENT:     Head: Normocephalic and atraumatic.     Nose:     Comments: Covered with a mask    Mouth/Throat:     Comments: Covered with a mask Eyes:     General: No scleral icterus.       Right eye: No discharge.        Left eye: No discharge.  Neck:     Comments: No palpable cervical or supraclavicular lymphadenopathy.  The trachea is midline.  No thyromegaly or dominant thyroid masses appreciated.  The gland moves freely with deglutition. Cardiovascular:     Rate and Rhythm: Normal rate and regular rhythm.     Pulses: Normal pulses.  Pulmonary:     Effort: Pulmonary effort is normal.     Breath sounds: Normal breath sounds.  Chest:       Comments: Palpable 3 cm mass at 2:00 near the chest wall.  It is well-circumscribed and mobile.  There is a smaller mass just medial and  deep to it, consistent with the imaging that has already been performed. Abdominal:     General: Bowel sounds are normal.     Palpations: Abdomen is soft.  Genitourinary:    Comments: Deferred Musculoskeletal:        General: No swelling or deformity.  Skin:    General: Skin is warm and dry.  Neurological:     General: No focal deficit present.     Mental Status: She is alert and oriented to person, place, and time.  Psychiatric:        Mood and Affect: Mood normal.        Behavior: Behavior normal.      Data Reviewed Imaging and pathology reviewed.  I concur with radiology interpretations.   CLINICAL DATA:  Palpable mass in the RIGHT breast   EXAM: ULTRASOUND OF THE RIGHT BREAST   COMPARISON:  None.   FINDINGS: On physical exam, there is a hard mass in the upper inner breast.   Targeted ultrasound was performed of the site of palpable concern. At the site of palpable concern at 2 o'clock 7 cm from the nipple, there is an oval hypoechoic mass with angular margins. It measures approximately 3.1 by 2.9 x 1.6 cm. Immediately  adjacent to this mass, there is an additional oval circumscribed hypoechoic mass. It measures 11 x 8 by 9 mm.   Targeted ultrasound was performed of the RIGHT axilla. No suspicious axillary lymph nodes are seen.   IMPRESSION: 1. At the site of palpable concern in the RIGHT upper inner breast, there is a 3.1 cm mass which is indeterminate. Recommend ultrasound-guided biopsy for definitive characterization. Even if there are benign biopsy results, surgical referral should be considered given mass size. 2. There is an immediately adjacent 11 mm probably benign mass. If there are benign biopsy results, recommend follow-up ultrasound in 6 months. If surgical excision is pursued, this mass could be excised at time of surgery given close proximity to dominant mass. 3. No RIGHT axillary adenopathy.   RECOMMENDATION: RIGHT breast ultrasound-guided biopsy x1   I have discussed the findings and recommendations with the patient. If applicable, a reminder letter will be sent to the patient regarding the next appointment. Patient will be scheduled for biopsy at her earliest convenience by the schedulers and ordering provider will be notified.   BI-RADS CATEGORY  4: Suspicious.   ADDENDUM REPORT: 05/20/2021 11:45   ADDENDUM: PATHOLOGY revealed: A. RIGHT BREAST, 2:00 o'clock, 7 CMFN; ULTRASOUND-GUIDED BIOPSY: - FIBROADENOMA. - NEGATIVE FOR ATYPIA AND MALIGNANCY.   Pathology results are CONCORDANT with imaging findings, per Dr. Valentino Saxon.   Pathology results and recommendations were discussed with patient via telephone on 05/20/2021. Patient reported doing well after the biopsy with no adverse symptoms, and only slight tenderness at the site. Post biopsy care instructions were reviewed, questions were answered and my direct phone number was provided. Patient was instructed to call [facility] for any additional questions or concerns related to biopsy site.   Recommend  surgical consultation for consideration of excision due to size of mass and patient preference. If surgical intervention, recommend immediately adjacent mass be excised at time of surgery. Request for surgical consultation relayed to Al Pimple RN and Tanya Nones RN at [facility] by Electa Sniff RN on 05/20/2021.   If surgical excision is not performed, then recommend follow up US in 6 months of the immediately adjacent probably benign mass which likely reflects an additional fibroadenoma.  Pathology results reported by Electa Sniff RN on 05/20/2021.   Assessment This is a 25 year old woman with a right breast mass that she self identified.  There is a second mass just adjacent to it with nearly identical sonographic characteristics.  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  She is interested in surgical removal.   Plan I have offered her excision of 2 right breast masses.  The risk the operation were discussed with her.  These include, but are not limited to, bleeding, infection, hematoma, seroma, recurrence, unanticipated pathology necessitating additional interventions or treatments.  She just started a new job and does want to defer for a few weeks prior to having her operation.  We will make the arrangements and work around her preferred timetable.  No changes to above.  Patient questions answered.  Will proceed with surgery.

## 2021-07-26 NOTE — Anesthesia Procedure Notes (Signed)
Procedure Name: LMA Insertion Date/Time: 07/26/2021 7:44 AM Performed by: Nelda Marseille, CRNA Pre-anesthesia Checklist: Patient identified, Patient being monitored, Timeout performed, Emergency Drugs available and Suction available Patient Re-evaluated:Patient Re-evaluated prior to induction Oxygen Delivery Method: Circle system utilized Preoxygenation: Pre-oxygenation with 100% oxygen Induction Type: IV induction Ventilation: Mask ventilation without difficulty LMA: LMA inserted LMA Size: 4.0 Tube type: Oral Number of attempts: 1 Placement Confirmation: positive ETCO2 and breath sounds checked- equal and bilateral Tube secured with: Tape Dental Injury: Teeth and Oropharynx as per pre-operative assessment  Comments: Inserted by Theresa Mulligan, SRNA

## 2021-07-26 NOTE — Discharge Instructions (Signed)
AMBULATORY SURGERY  ?DISCHARGE INSTRUCTIONS ? ? ?The drugs that you were given will stay in your system until tomorrow so for the next 24 hours you should not: ? ?Drive an automobile ?Make any legal decisions ?Drink any alcoholic beverage ? ? ?You may resume regular meals tomorrow.  Today it is better to start with liquids and gradually work up to solid foods. ? ?You may eat anything you prefer, but it is better to start with liquids, then soup and crackers, and gradually work up to solid foods. ? ? ?Please notify your doctor immediately if you have any unusual bleeding, trouble breathing, redness and pain at the surgery site, drainage, fever, or pain not relieved by medication. ? ? ? ?Additional Instructions: ? ? ? ?Please contact your physician with any problems or Same Day Surgery at [phone], Monday through Friday 6 am to 4 pm, or [facility] [place] Main number at [phone].  ?

## 2021-07-27 ENCOUNTER — Encounter: Payer: Self-pay | Admitting: General Surgery

## 2021-07-27 LAB — SURGICAL PATHOLOGY

## 2021-07-27 NOTE — Anesthesia Postprocedure Evaluation (Signed)
Anesthesia Post Note  Patient: Caisha Kufahl  Procedure(s) Performed: CYST EXCISION BREAST, excision of breast fibroadenoma x 2 (Right)  Patient location during evaluation: PACU Anesthesia Type: General Level of consciousness: awake and alert and oriented Pain management: pain level controlled Vital Signs Assessment: post-procedure vital signs reviewed and stable Respiratory status: spontaneous breathing, nonlabored ventilation and respiratory function stable Cardiovascular status: blood pressure returned to baseline and stable Postop Assessment: no signs of nausea or vomiting Anesthetic complications: no   No notable events documented.   Last Vitals:  Vitals:   07/26/21 0914 07/26/21 0941  BP: 105/62 108/70  Pulse: 62 63  Resp: 16 16  Temp: 36.7 C   SpO2: 100% 100%    Last Pain:  Vitals:   07/26/21 0941  TempSrc:   PainSc: 0-No pain                 Mckinlee Dunk

## 2021-08-10 ENCOUNTER — Ambulatory Visit (INDEPENDENT_AMBULATORY_CARE_PROVIDER_SITE_OTHER): Payer: Self-pay | Admitting: General Surgery

## 2021-08-10 ENCOUNTER — Encounter: Payer: Self-pay | Admitting: General Surgery

## 2021-08-10 ENCOUNTER — Other Ambulatory Visit: Payer: Self-pay

## 2021-08-10 VITALS — BP 117/75 | HR 80 | Temp 98.5°F | Ht 67.0 in | Wt 215.0 lb

## 2021-08-10 DIAGNOSIS — Z9889 Other specified postprocedural states: Secondary | ICD-10-CM

## 2021-08-10 NOTE — Progress Notes (Signed)
Denise Gates is here today for a postoperative visit.  She underwent excision of 2 breast masses on July 26, 2021.  Due to their proximity, I was able to excise both through the same incision.  Final pathology demonstrated:  SURGICAL PATHOLOGY  CASE: [id number]  PATIENT: [facility]  Surgical Pathology Report      Specimen Submitted:  A. Breast, right; excision   Clinical History: Right breast fibroadenoma x 2       DIAGNOSIS:  A. BREAST, RIGHT; EXCISION:  - FIBROADENOMA, 2.9 CM, WITH BIOPSY SITE CHANGES AND MARKER CLIP  PRESENT.  - FIBROADENOMA, 1.2 CM, LOCATED 1 CM FROM THE LARGER LESION.  - NEGATIVE FOR ATYPIA AND MALIGNANCY.   She says that she has done well since her operation.  She still endorses some mild tenderness at the surgical site, worse if she lies on it wrong while sleeping.  She has been using ibuprofen alone for pain control.  She denies any fevers or chills.  No nausea or vomiting.  Today's Vitals   08/10/21 0958  BP: 117/75  Pulse: 80  Temp: 98.5 F (36.9 C)  TempSrc: Oral  SpO2: 98%  Weight: 215 lb (97.5 kg)  Height: '5\' 7"'$  (1.702 m)   Body mass index is 33.67 kg/m. Focused examination of the surgical site demonstrates that the Steri-Strips are still in place.  These were removed to reveal a well approximated incision.  There is no demonstrable fluid collection.  No erythema, induration, or drainage.  Impression and plan: This is a 25 year old woman who had a right breast mass.  On core biopsy, it was benign, but due to the size, it was recommended that she undergo excision.  She had a second lesion adjacent which was also excised at the same time.  Both were benign.  Overall she is doing well.  She may resume all of her usual activities without restriction.  A note was provided for her employer to this effect and that she may return to work on Monday.  We will see her on an as-needed basis.

## 2021-08-10 NOTE — Patient Instructions (Addendum)
Please call if you have any questions or concerns. We removed the steri strips today. You may shower as usual. You may massage vitamin E oil, shea butter on the incision to help with the scarring.

## 2021-08-17 ENCOUNTER — Encounter: Payer: Self-pay | Admitting: General Surgery

## 2021-10-26 ENCOUNTER — Ambulatory Visit: Payer: Medicaid Other | Admitting: Internal Medicine

## 2021-11-03 ENCOUNTER — Ambulatory Visit: Payer: Medicaid Other | Admitting: Internal Medicine

## 2021-12-07 ENCOUNTER — Ambulatory Visit: Payer: Medicaid Other | Admitting: Internal Medicine

## 2023-01-22 IMAGING — US US BREAST*R* LIMITED INC AXILLA
1 series · 13 of 24 positions shown · non-contrast
Comparison: None.

CLINICAL DATA: Palpable mass in the RIGHT breast

EXAM:
ULTRASOUND OF THE RIGHT BREAST

[Series 1: us breast*right* limited inc axilla · 0.07mm/px · 13 of 24 slices shown]
[im 1/24]
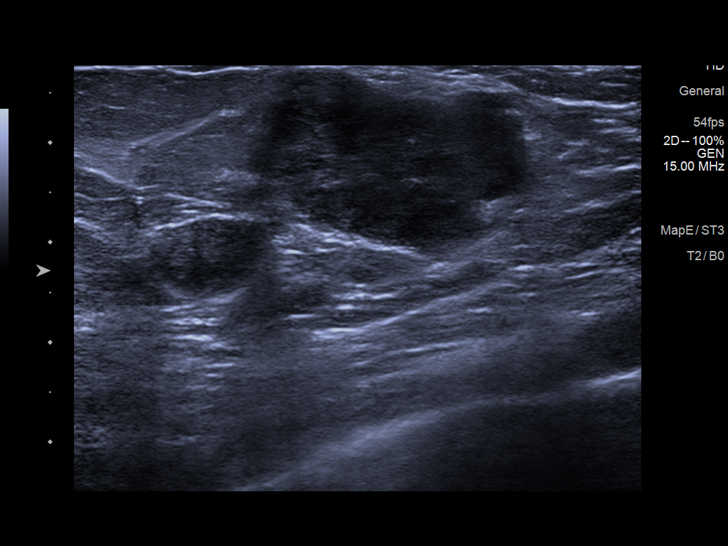
[im 3/24]
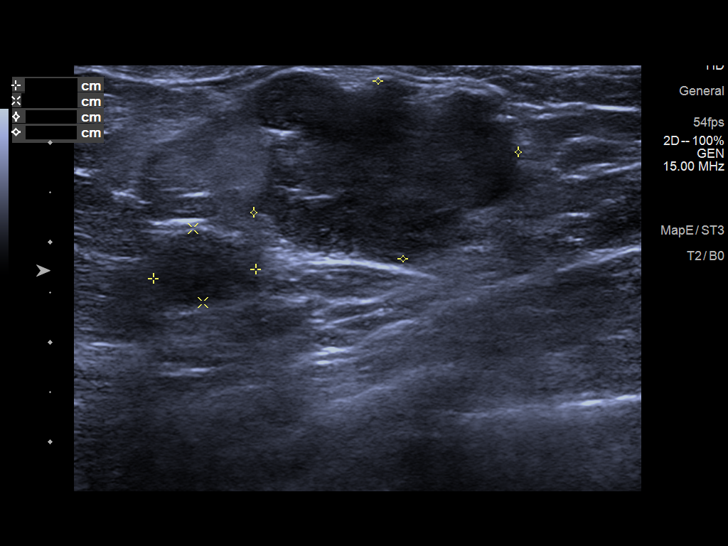
[im 5/24]
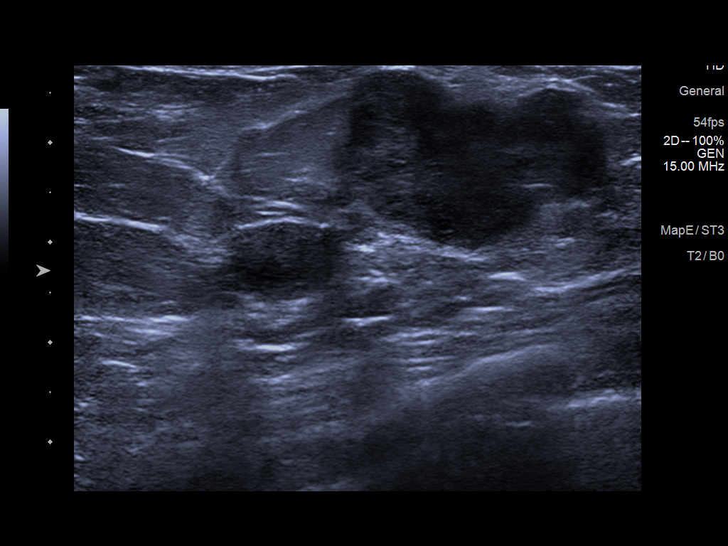
[im 7/24]
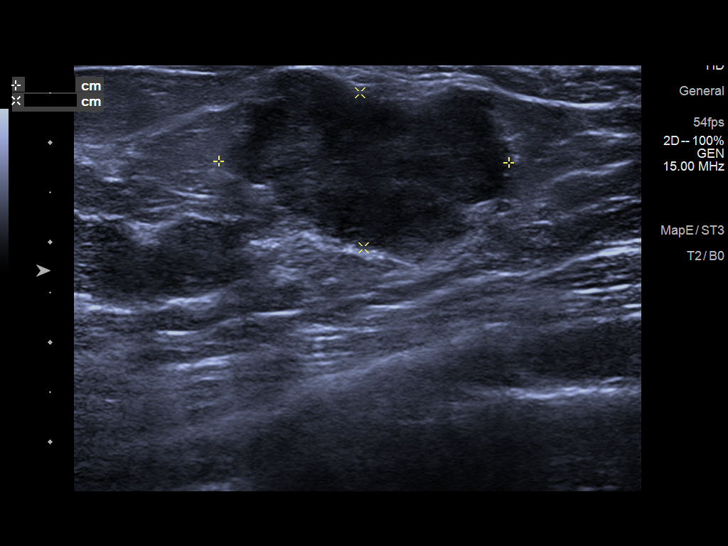
[im 9/24]
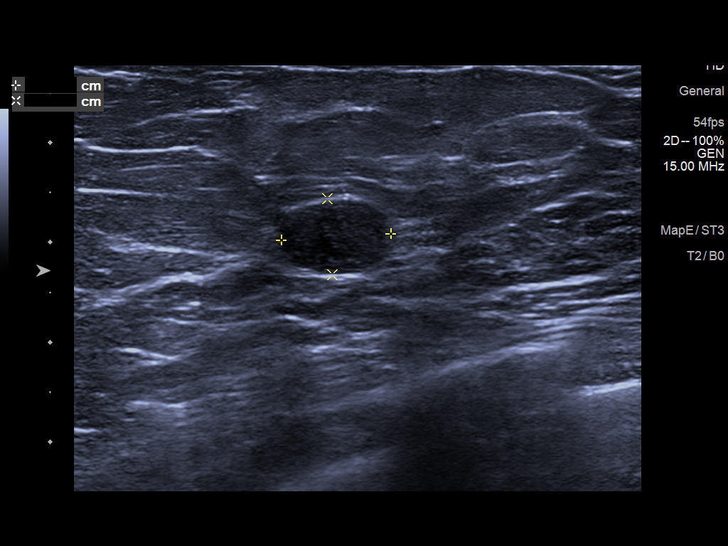
[im 11/24]
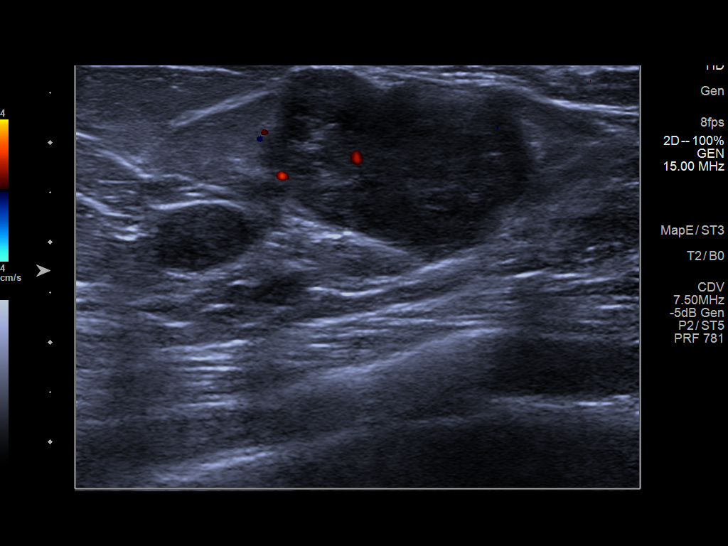
[im 13/24]
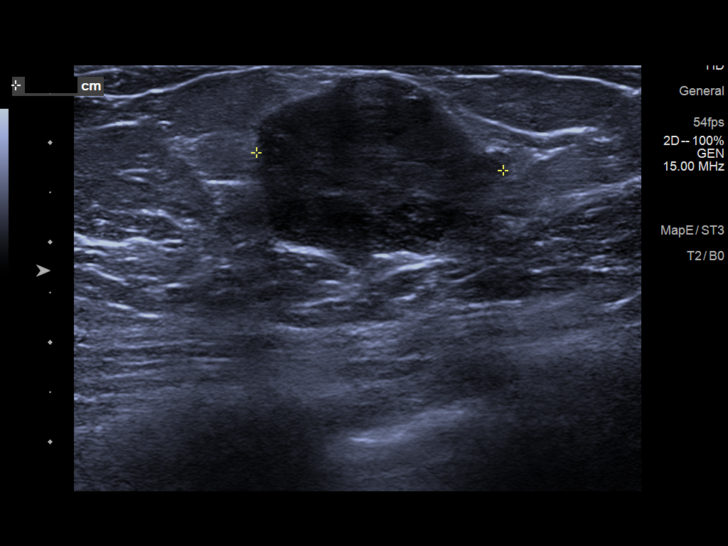
[im 14/24]
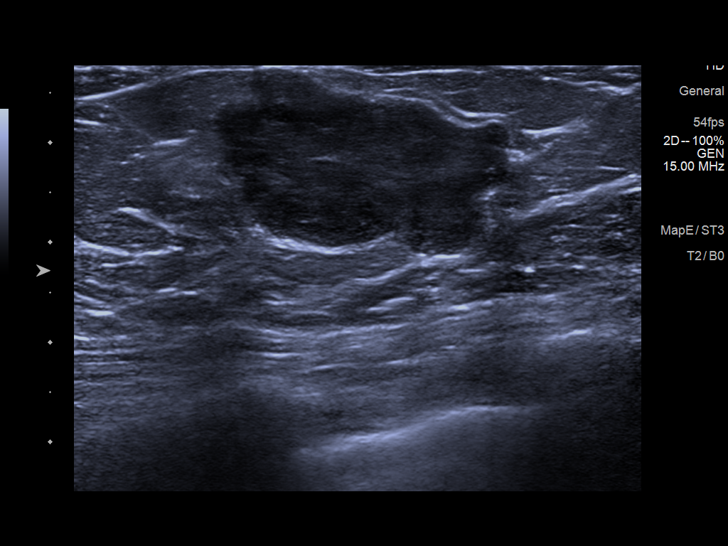
[im 16/24]
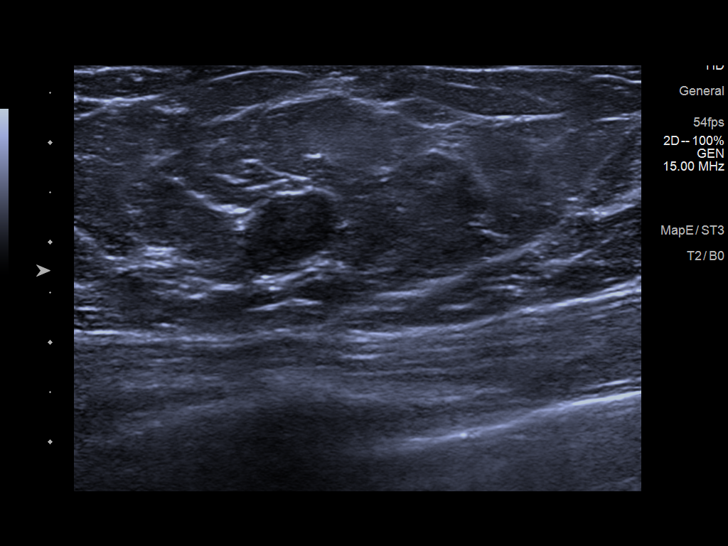
[im 18/24]
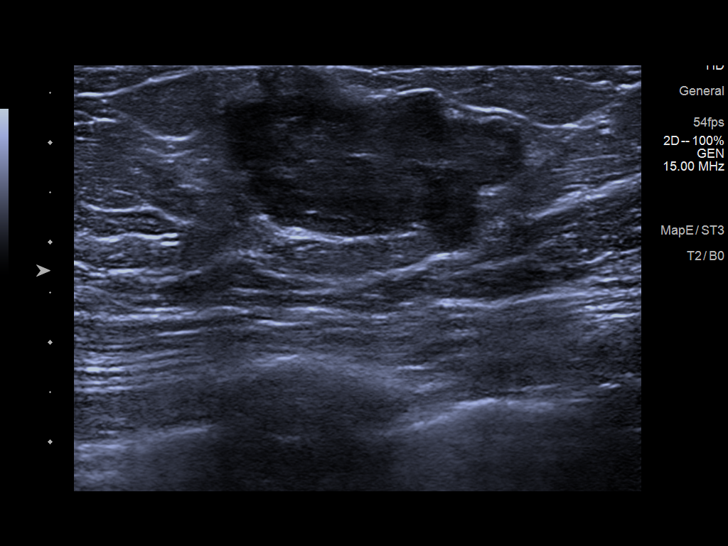
[im 20/24]
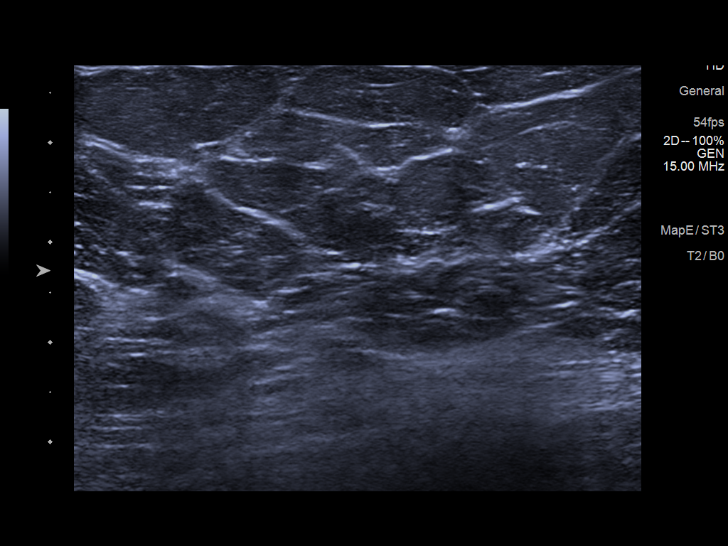
[im 22/24]
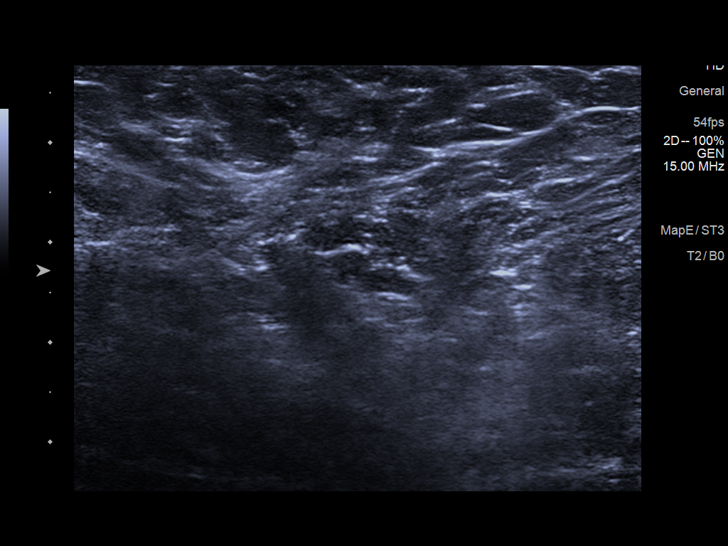
[im 24/24]
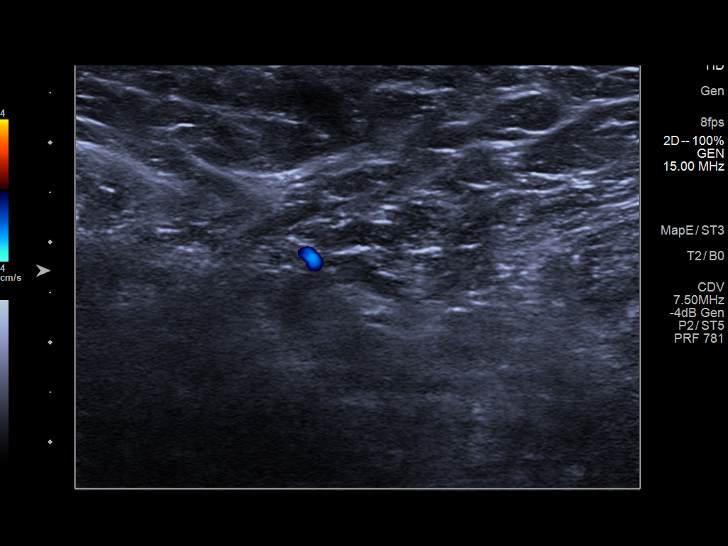

[13 of 24 positions shown; findings below may reference images not displayed]

FINDINGS: On physical exam, there is a hard mass in the upper inner breast.

Targeted ultrasound was performed of the site of palpable concern.
At the site of palpable concern at 2 o'clock 7 cm from the nipple,
there is an oval hypoechoic mass with angular margins. It measures
approximately 3.1 by 2.9 x 1.6 cm. Immediately adjacent to this
mass, there is an additional oval circumscribed hypoechoic mass. It
measures 11 x 8 by 9 mm.

Targeted ultrasound was performed of the RIGHT axilla. No suspicious
axillary lymph nodes are seen.
IMPRESSION: 1. At the site of palpable concern in the RIGHT upper inner breast,
there is a 3.1 cm mass which is indeterminate. Recommend
ultrasound-guided biopsy for definitive characterization. Even if
there are benign biopsy results, surgical referral should be
considered given mass size.
2. There is an immediately adjacent 11 mm probably benign mass. If
there are benign biopsy results, recommend follow-up ultrasound in 6
months. If surgical excision is pursued, this mass could be excised
at time of surgery given close proximity to dominant mass.
3. No RIGHT axillary adenopathy.

RECOMMENDATION:
RIGHT breast ultrasound-guided biopsy x1

I have discussed the findings and recommendations with the patient.
If applicable, a reminder letter will be sent to the patient
regarding the next appointment. Patient will be scheduled for biopsy
at her earliest convenience by the schedulers and ordering provider
will be notified.

BI-RADS CATEGORY  4: Suspicious.

## 2023-04-27 NOTE — Progress Notes (Deleted)
New patient visit   Patient: Denise Gates   DOB: 11/14/1996   26 y.o. Female  MRN: [id number] Visit Date: 04/28/2023  Today's healthcare provider: Debera Lat, PA-C   No chief complaint on file.  Subjective    Denise Gates is a 27 y.o. female who presents today as a new patient to establish care.  HPI  ***  Past Medical History:  Diagnosis Date   Breast disorder    lump on right breast   Past Surgical History:  Procedure Laterality Date   BREAST CYST EXCISION Right 07/26/2021   Procedure: CYST EXCISION BREAST, excision of breast fibroadenoma x 2;  Surgeon: Duanne Guess, MD;  Location: [facility] ORS;  Service: General;  Laterality: Right;   NO PAST SURGERIES     Family Status  Relation Name Status   Mother  Alive   Father  Alive   Family History  Problem Relation Age of Onset   Endometriosis Mother    Social History   Socioeconomic History   Marital status: Single    Spouse name: Not on file   Number of children: 0   Years of education: 12   Highest education level: Not on file  Occupational History   Not on file  Tobacco Use   Smoking status: Never   Smokeless tobacco: Never  Vaping Use   Vaping Use: Never used  Substance and Sexual Activity   Alcohol use: Never   Drug use: Never   Sexual activity: Never    Birth control/protection: Abstinence  Other Topics Concern   Not on file  Social History Narrative   Not on file   Social Determinants of Health   Financial Resource Strain: Not on file  Food Insecurity: Not on file  Transportation Needs: Not on file  Physical Activity: Not on file  Stress: Not on file  Social Connections: Not on file   Outpatient Medications Prior to Visit  Medication Sig   HYDROcodone-acetaminophen (NORCO/VICODIN) 5-325 MG tablet Take 1 tablet by mouth every 6 (six) hours as needed for moderate pain.   ibuprofen (ADVIL) 800 MG tablet Take 1 tablet (800 mg total) by mouth every 8 (eight) hours as needed.    No facility-administered medications prior to visit.   No Known Allergies   There is no immunization history on file for this patient.  Health Maintenance  Topic Date Due   COVID-19 Vaccine (1) Never done   HPV VACCINES (1 - 2-dose series) Never done   DTaP/Tdap/Td (1 - Tdap) Never done   INFLUENZA VACCINE  06/29/2023   PAP-Cervical Cytology Screening  04/22/2024   PAP SMEAR-Modifier  04/22/2024   Hepatitis C Screening  Completed   HIV Screening  Completed    Patient Care Team: Pcp, No as PCP - General Jim Like, RN as Registered Nurse Scarlett Presto, RN (Inactive) as Registered Nurse  Review of Systems  {Labs  Heme  Chem  Endocrine  Serology  Results Review (optional):23779}   Objective    There were no vitals taken for this visit. {Show previous vital signs (optional):23777}  Physical Exam ***  Depression Screen    04/22/2021    9:19 AM  PHQ 2/9 Scores  PHQ - 2 Score 0   No results found for any visits on 04/28/23.  Assessment & Plan     ***  No follow-ups on file.     {provider attestation***:1}   Debera Lat, PA-C  [facility] [phone] (phone) [phone] (  fax)  [facility]

## 2023-04-28 ENCOUNTER — Ambulatory Visit: Payer: Medicaid Other | Admitting: Physician Assistant

## 2023-05-16 NOTE — Progress Notes (Deleted)
Vivien Rota Rene Gonsoulin,acting as a Neurosurgeon for [employer], PA-C.,have documented all relevant documentation on the behalf of Debera Lat, PA-C,as directed by  [employer], PA-C while in the presence of [employer], PA-C.   New patient visit   Patient: Denise Gates   DOB: May 21, 1996   26 y.o. Female  MRN: [id number] Visit Date: 05/17/2023  Today's healthcare provider: Debera Lat, PA-C   No chief complaint on file.  Subjective    Denise Gates is a 27 y.o. female who presents today as a new patient to establish care.  HPI  ***  Past Medical History:  Diagnosis Date   Breast disorder    lump on right breast   Past Surgical History:  Procedure Laterality Date   BREAST CYST EXCISION Right 07/26/2021   Procedure: CYST EXCISION BREAST, excision of breast fibroadenoma x 2;  Surgeon: Duanne Guess, MD;  Location: [facility] ORS;  Service: General;  Laterality: Right;   NO PAST SURGERIES     Family Status  Relation Name Status   Mother  Alive   Father  Alive   Family History  Problem Relation Age of Onset   Endometriosis Mother    Social History   Socioeconomic History   Marital status: Single    Spouse name: Not on file   Number of children: 0   Years of education: 12   Highest education level: Not on file  Occupational History   Not on file  Tobacco Use   Smoking status: Never   Smokeless tobacco: Never  Vaping Use   Vaping Use: Never used  Substance and Sexual Activity   Alcohol use: Never   Drug use: Never   Sexual activity: Never    Birth control/protection: Abstinence  Other Topics Concern   Not on file  Social History Narrative   Not on file   Social Determinants of Health   Financial Resource Strain: Not on file  Food Insecurity: Not on file  Transportation Needs: Not on file  Physical Activity: Not on file  Stress: Not on file  Social Connections: Not on file   Outpatient Medications Prior to Visit  Medication Sig    HYDROcodone-acetaminophen (NORCO/VICODIN) 5-325 MG tablet Take 1 tablet by mouth every 6 (six) hours as needed for moderate pain.   ibuprofen (ADVIL) 800 MG tablet Take 1 tablet (800 mg total) by mouth every 8 (eight) hours as needed.   No facility-administered medications prior to visit.   No Known Allergies   There is no immunization history on file for this patient.  Health Maintenance  Topic Date Due   COVID-19 Vaccine (1) Never done   HPV VACCINES (1 - 2-dose series) Never done   DTaP/Tdap/Td (1 - Tdap) Never done   INFLUENZA VACCINE  06/29/2023   PAP-Cervical Cytology Screening  04/22/2024   PAP SMEAR-Modifier  04/22/2024   Hepatitis C Screening  Completed   HIV Screening  Completed    Patient Care Team: Pcp, No as PCP - General Jim Like, RN as Registered Nurse Scarlett Presto, RN (Inactive) as Registered Nurse  Review of Systems  Constitutional: Negative.   HENT: Negative.    Eyes: Negative.   Respiratory: Negative.    Cardiovascular: Negative.   Gastrointestinal: Negative.   Endocrine: Negative.   Genitourinary: Negative.   Musculoskeletal: Negative.   Skin: Negative.   Allergic/Immunologic: Negative.   Neurological: Negative.   Hematological: Negative.   Psychiatric/Behavioral: Negative.      {Labs  Heme  Chem  Endocrine  Serology  Results Review (optional):23779}   Objective    There were no vitals taken for this visit. {Show previous vital signs (optional):23777}  Physical Exam ***  Depression Screen    04/22/2021    9:19 AM  PHQ 2/9 Scores  PHQ - 2 Score 0   No results found for any visits on 05/17/23.  Assessment & Plan     ***  No follow-ups on file.     {provider attestation***:1}   Debera Lat, PA-C  [facility] [phone] (phone) [phone] (fax)  [facility]

## 2023-05-17 ENCOUNTER — Ambulatory Visit: Payer: Medicaid Other | Admitting: Physician Assistant

## 2023-05-17 DIAGNOSIS — Z7689 Persons encountering health services in other specified circumstances: Secondary | ICD-10-CM

## 2023-09-25 NOTE — Progress Notes (Unsigned)
New patient visit  Patient: Denise Gates   DOB: Sep 22, 1996   27 y.o. Female  MRN: [id number] Visit Date: 09/26/2023  Today's healthcare provider: Debera Lat, PA-C   No chief complaint on file.  Subjective    Denise Gates is a 27 y.o. female who presents today as a new patient to establish care.  HPI  *** Discussed the use of AI scribe software for clinical note transcription with the patient, who gave verbal consent to proceed.  History of Present Illness            Past Medical History:  Diagnosis Date   Breast disorder    lump on right breast   Past Surgical History:  Procedure Laterality Date   BREAST CYST EXCISION Right 07/26/2021   Procedure: CYST EXCISION BREAST, excision of breast fibroadenoma x 2;  Surgeon: Duanne Guess, MD;  Location: [facility] ORS;  Service: General;  Laterality: Right;   NO PAST SURGERIES     Family Status  Relation Name Status   Mother  Alive   Father  Alive  No partnership data on file   Family History  Problem Relation Age of Onset   Endometriosis Mother    Social History   Socioeconomic History   Marital status: Single    Spouse name: Not on file   Number of children: 0   Years of education: 12   Highest education level: GED or equivalent  Occupational History   Not on file  Tobacco Use   Smoking status: Never   Smokeless tobacco: Never  Vaping Use   Vaping status: Never Used  Substance and Sexual Activity   Alcohol use: Never   Drug use: Never   Sexual activity: Never    Birth control/protection: Abstinence  Other Topics Concern   Not on file  Social History Narrative   Not on file   Social Determinants of Health   Financial Resource Strain: Medium Risk (09/25/2023)   Overall Financial Resource Strain (CARDIA)    Difficulty of Paying Living Expenses: Somewhat hard  Food Insecurity: Food Insecurity Present (09/25/2023)   Hunger Vital Sign    Worried About Running Out of Food in the Last Year:  Often true    Ran Out of Food in the Last Year: Sometimes true  Transportation Needs: Unmet Transportation Needs (09/25/2023)   PRAPARE - Administrator, Civil Service (Medical): Yes    Lack of Transportation (Non-Medical): No  Physical Activity: Sufficiently Active (09/25/2023)   Exercise Vital Sign    Days of Exercise per Week: 5 days    Minutes of Exercise per Session: 50 min  Stress: No Stress Concern Present (09/25/2023)   [employer] of Occupational Health - Occupational Stress Questionnaire    Feeling of Stress : Only a little  Social Connections: Moderately Isolated (09/25/2023)   Social Connection and Isolation Panel [NHANES]    Frequency of Communication with Friends and Family: Three times a week    Frequency of Social Gatherings with Friends and Family: Three times a week    Attends Religious Services: 1 to 4 times per year    Active Member of Clubs or Organizations: No    Attends Engineer, structural: Not on file    Marital Status: Never married   Outpatient Medications Prior to Visit  Medication Sig   HYDROcodone-acetaminophen (NORCO/VICODIN) 5-325 MG tablet Take 1 tablet by mouth every 6 (six) hours as needed for moderate pain.   ibuprofen (ADVIL) 800 MG tablet Take  1 tablet (800 mg total) by mouth every 8 (eight) hours as needed.   No facility-administered medications prior to visit.   No Known Allergies   There is no immunization history on file for this patient.  Health Maintenance  Topic Date Due   HPV VACCINES (1 - 3-dose series) Never done   DTaP/Tdap/Td (1 - Tdap) Never done   INFLUENZA VACCINE  Never done   COVID-19 Vaccine (1 - 2023-24 season) Never done   Cervical Cancer Screening (Pap smear)  04/22/2024   Hepatitis C Screening  Completed   HIV Screening  Completed    Patient Care Team: Pcp, No as PCP - General Jim Like, RN as Registered Nurse Scarlett Presto, RN (Inactive) as Registered Nurse  Review of  Systems Except see HPI   {Insert previous labs (optional):23779} {See past labs  Heme  Chem  Endocrine  Serology  Results Review (optional):1}   Objective    There were no vitals taken for this visit. {Insert last BP/Wt (optional):23777}{See vitals history (optional):1}   Physical Exam  Depression Screen    04/22/2021    9:19 AM  PHQ 2/9 Scores  PHQ - 2 Score 0   No results found for any visits on 09/26/23.  Assessment & Plan     *** Assessment and Plan              Encounter to establish care Welcomed to our clinic Reviewed past medical hx, social hx, family hx and surgical hx Pt advised to send all vaccination records or screening   No follow-ups on file.     [facility]

## 2023-09-26 ENCOUNTER — Ambulatory Visit (INDEPENDENT_AMBULATORY_CARE_PROVIDER_SITE_OTHER): Payer: Medicaid Other | Admitting: Physician Assistant

## 2023-09-26 ENCOUNTER — Encounter: Payer: Self-pay | Admitting: Physician Assistant

## 2023-09-26 DIAGNOSIS — R590 Localized enlarged lymph nodes: Secondary | ICD-10-CM

## 2023-09-26 DIAGNOSIS — N926 Irregular menstruation, unspecified: Secondary | ICD-10-CM | POA: Diagnosis not present

## 2023-09-26 DIAGNOSIS — D229 Melanocytic nevi, unspecified: Secondary | ICD-10-CM

## 2023-09-26 DIAGNOSIS — Z7689 Persons encountering health services in other specified circumstances: Secondary | ICD-10-CM

## 2023-09-26 DIAGNOSIS — Z833 Family history of diabetes mellitus: Secondary | ICD-10-CM

## 2023-09-26 DIAGNOSIS — J358 Other chronic diseases of tonsils and adenoids: Secondary | ICD-10-CM

## 2023-09-26 DIAGNOSIS — N6312 Unspecified lump in the right breast, upper inner quadrant: Secondary | ICD-10-CM

## 2023-09-27 DIAGNOSIS — R590 Localized enlarged lymph nodes: Secondary | ICD-10-CM | POA: Insufficient documentation

## 2023-09-27 DIAGNOSIS — Z833 Family history of diabetes mellitus: Secondary | ICD-10-CM | POA: Insufficient documentation

## 2023-09-27 DIAGNOSIS — N926 Irregular menstruation, unspecified: Secondary | ICD-10-CM | POA: Insufficient documentation

## 2023-09-27 LAB — LIPID PANEL
Chol/HDL Ratio: 3.1 ratio (ref 0.0–4.4)
Cholesterol, Total: 175 mg/dL (ref 100–199)
HDL: 57 mg/dL (ref >39)
LDL Chol Calc (NIH): 109 mg/dL — ABNORMAL HIGH (ref 0–99)
Triglycerides: 45 mg/dL (ref 0–149)
VLDL Cholesterol Cal: 9 mg/dL (ref 5–40)

## 2023-09-27 LAB — COMPREHENSIVE METABOLIC PANEL
ALT: 17 [IU]/L (ref 0–32)
AST: 16 [IU]/L (ref 0–40)
Albumin: 4.3 g/dL (ref 4.0–5.0)
Alkaline Phosphatase: 68 [IU]/L (ref 44–121)
BUN/Creatinine Ratio: 18 (ref 9–23)
BUN: 12 mg/dL (ref 6–20)
Bilirubin Total: 0.6 mg/dL (ref 0.0–1.2)
CO2: 22 mmol/L (ref 20–29)
Calcium: 9.6 mg/dL (ref 8.7–10.2)
Chloride: 103 mmol/L (ref 96–106)
Creatinine, Ser: 0.67 mg/dL (ref 0.57–1.00)
Globulin, Total: 3 g/dL (ref 1.5–4.5)
Glucose: 89 mg/dL (ref 70–99)
Potassium: 4.4 mmol/L (ref 3.5–5.2)
Sodium: 139 mmol/L (ref 134–144)
Total Protein: 7.3 g/dL (ref 6.0–8.5)
eGFR: 124 mL/min/{1.73_m2} (ref >59)

## 2023-09-27 LAB — CBC WITH DIFFERENTIAL/PLATELET
Basophils Absolute: 0.1 10*3/uL (ref 0.0–0.2)
Basos: 1 %
EOS (ABSOLUTE): 0.1 10*3/uL (ref 0.0–0.4)
Eos: 1 %
Hematocrit: 39.8 % (ref 34.0–46.6)
Hemoglobin: 13 g/dL (ref 11.1–15.9)
Immature Grans (Abs): 0 10*3/uL (ref 0.0–0.1)
Immature Granulocytes: 0 %
Lymphocytes Absolute: 2.2 10*3/uL (ref 0.7–3.1)
Lymphs: 41 %
MCH: 28.4 pg (ref 26.6–33.0)
MCHC: 32.7 g/dL (ref 31.5–35.7)
MCV: 87 fL (ref 79–97)
Monocytes Absolute: 0.6 10*3/uL (ref 0.1–0.9)
Monocytes: 11 %
Neutrophils Absolute: 2.5 10*3/uL (ref 1.4–7.0)
Neutrophils: 46 %
Platelets: 286 10*3/uL (ref 150–450)
RBC: 4.58 x10E6/uL (ref 3.77–5.28)
RDW: 12.5 % (ref 11.7–15.4)
WBC: 5.5 10*3/uL (ref 3.4–10.8)

## 2023-09-27 LAB — TSH: TSH: 1.4 u[IU]/mL (ref 0.450–4.500)

## 2023-09-27 LAB — HEMOGLOBIN A1C
Est. average glucose Bld gHb Est-mCnc: 111 mg/dL
Hgb A1c MFr Bld: 5.5 % (ref 4.8–5.6)

## 2023-10-05 ENCOUNTER — Encounter: Payer: Medicaid Other | Admitting: Certified Nurse Midwife

## 2023-10-19 ENCOUNTER — Encounter: Payer: Self-pay | Admitting: Obstetrics

## 2023-10-19 ENCOUNTER — Ambulatory Visit (INDEPENDENT_AMBULATORY_CARE_PROVIDER_SITE_OTHER): Payer: Medicaid Other | Admitting: Obstetrics

## 2023-10-19 ENCOUNTER — Other Ambulatory Visit (HOSPITAL_COMMUNITY)
Admission: RE | Admit: 2023-10-19 | Discharge: 2023-10-19 | Disposition: A | Discharge status: Home / Self Care | Payer: Medicaid Other | Source: Ambulatory Visit | Attending: Obstetrics | Admitting: Obstetrics

## 2023-10-19 VITALS — Ht 67.0 in | Wt 253.8 lb

## 2023-10-19 DIAGNOSIS — N946 Dysmenorrhea, unspecified: Secondary | ICD-10-CM | POA: Diagnosis not present

## 2023-10-19 DIAGNOSIS — Z3009 Encounter for other general counseling and advice on contraception: Secondary | ICD-10-CM

## 2023-10-19 DIAGNOSIS — Z113 Encounter for screening for infections with a predominantly sexual mode of transmission: Secondary | ICD-10-CM | POA: Diagnosis present

## 2023-10-19 DIAGNOSIS — Z7689 Persons encountering health services in other specified circumstances: Secondary | ICD-10-CM | POA: Insufficient documentation

## 2023-10-19 NOTE — Progress Notes (Signed)
Subjective:    Denise Gates is a 27 y.o. female who presents for contraception counseling. She has a history of heavy periods with painful cramps that are causing her to miss work. She reports nausea, vomiting, and headache with her periods. She is concerned about weight gain. She is sexually active and desires STI testing today. Pertinent past medical history: none.  Menstrual History: OB History     Gravida  0   Para  0   Term  0   Preterm  0   AB  0   Living  0      SAB  0   IAB  0   Ectopic  0   Multiple  0   Live Births  0           Menarche age: 21 or 68 Patient's last menstrual period was 10/12/2023. Period Cycle (Days): 28 Period Duration (Days): 5 Period Pattern: Regular Menstrual Flow: Heavy Menstrual Control: Panty liner, Thin pad, Maxi pad Menstrual Control Change Freq (Hours): 1 Dysmenorrhea: (!) Severe  The following portions of the patient's history were reviewed and updated as appropriate: allergies, current medications, past medical history, past surgical history, and problem list.  Review of Systems Pertinent items are noted in HPI.   Objective:    No exam performed today, not medically indicated for contraceptive counseling.   Assessment:   -Desires hormonal contraception for heavy periods -Routine STI testing  Plan:    -Discussed all available options for hormonal contraception. Link to bedsider.org sent via MyChart. Denise Gates is considering IUD and will schedule an appointment if she decides to have this placed or reach out if she desires a different contraceptive method.  -STI swabs and labs today. F/u based on results.  Glenetta Borg, CNM

## 2023-10-20 LAB — HIV ANTIBODY (ROUTINE TESTING W REFLEX): HIV Screen 4th Generation wRfx: NONREACTIVE

## 2023-10-20 LAB — RPR: RPR Ser Ql: NONREACTIVE

## 2023-10-20 LAB — HEPATITIS B SURFACE ANTIGEN: Hepatitis B Surface Ag: NEGATIVE

## 2023-10-20 LAB — HEPATITIS C ANTIBODY: Hep C Virus Ab: NONREACTIVE

## 2023-10-23 ENCOUNTER — Encounter: Payer: Self-pay | Admitting: Obstetrics

## 2023-10-23 ENCOUNTER — Ambulatory Visit: Payer: Medicaid Other | Admitting: Physician Assistant

## 2023-10-23 ENCOUNTER — Other Ambulatory Visit: Payer: Self-pay | Admitting: Obstetrics

## 2023-10-23 LAB — CERVICOVAGINAL ANCILLARY ONLY
Bacterial Vaginitis (gardnerella): POSITIVE — AB
Candida Glabrata: NEGATIVE
Candida Vaginitis: NEGATIVE
Chlamydia: NEGATIVE
Comment: NEGATIVE
Comment: NEGATIVE
Comment: NEGATIVE
Comment: NEGATIVE
Comment: NEGATIVE
Comment: NORMAL
Neisseria Gonorrhea: NEGATIVE
Trichomonas: NEGATIVE

## 2023-10-23 MED ORDER — METRONIDAZOLE 500 MG PO TABS: 500.0000 mg | ORAL_TABLET | Freq: Two times a day (BID) | ORAL | 0 refills | Status: DC

## 2023-10-23 NOTE — Progress Notes (Signed)
+  BV. Rx for metronidazole 500 mg PO BID x 7 days sent to pharmacy. [facility] notified via MyChart.  M. Chryl Heck, CNM

## 2023-11-04 NOTE — Progress Notes (Unsigned)
  Established patient visit  Patient: Denise Gates   DOB: [birth year]   26 y.o. Female  MRN: [id number] Visit Date: 11/06/2023  Today's healthcare provider: Debera Lat, PA-C   No chief complaint on file.  Subjective    HPI  *** Discussed the use of AI scribe software for clinical note transcription with the patient, who gave verbal consent to proceed.  History of Present Illness               09/26/2023    3:22 PM 04/22/2021    9:19 AM  Depression screen PHQ 2/9  Decreased Interest 3 0  Down, Depressed, Hopeless 1 0  PHQ - 2 Score 4 0  Altered sleeping 0   Tired, decreased energy 0   Change in appetite 3   Feeling bad or failure about yourself  0   Trouble concentrating 0   Moving slowly or fidgety/restless 0   Suicidal thoughts 0   PHQ-9 Score 7   Difficult doing work/chores Somewhat difficult        No data to display          Medications: Outpatient Medications Prior to Visit  Medication Sig   metroNIDAZOLE (FLAGYL) 500 MG tablet Take 1 tablet (500 mg total) by mouth 2 (two) times daily.   No facility-administered medications prior to visit.    Review of Systems Except see HPI   {Insert previous labs (optional):23779} {See past labs  Heme  Chem  Endocrine  Serology  Results Review (optional):1}   Objective    LMP 10/12/2023  {Insert last BP/Wt (optional):23777}{See vitals history (optional):1}   Physical Exam   No results found for any visits on 11/06/23.  Assessment & Plan    *** Assessment and Plan              No follow-ups on file.      [facility]

## 2023-11-06 ENCOUNTER — Ambulatory Visit (INDEPENDENT_AMBULATORY_CARE_PROVIDER_SITE_OTHER): Payer: Medicaid Other | Admitting: Physician Assistant

## 2023-11-06 DIAGNOSIS — E7800 Pure hypercholesterolemia, unspecified: Secondary | ICD-10-CM

## 2023-11-06 DIAGNOSIS — D229 Melanocytic nevi, unspecified: Secondary | ICD-10-CM | POA: Diagnosis not present

## 2023-11-06 DIAGNOSIS — N926 Irregular menstruation, unspecified: Secondary | ICD-10-CM | POA: Diagnosis not present

## 2023-11-07 ENCOUNTER — Encounter: Payer: Self-pay | Admitting: Physician Assistant

## 2023-12-27 ENCOUNTER — Ambulatory Visit: Payer: Medicaid Other | Admitting: Dietician

## 2024-02-05 ENCOUNTER — Ambulatory Visit: Payer: Medicaid Other | Admitting: Physician Assistant

## 2024-02-05 ENCOUNTER — Encounter: Payer: Self-pay | Admitting: Physician Assistant

## 2024-02-05 DIAGNOSIS — Z6841 Body Mass Index (BMI) 40.0 and over, adult: Secondary | ICD-10-CM | POA: Diagnosis not present

## 2024-02-05 DIAGNOSIS — E041 Nontoxic single thyroid nodule: Secondary | ICD-10-CM

## 2024-02-05 DIAGNOSIS — E282 Polycystic ovarian syndrome: Secondary | ICD-10-CM

## 2024-02-05 DIAGNOSIS — F339 Major depressive disorder, recurrent, unspecified: Secondary | ICD-10-CM | POA: Diagnosis not present

## 2024-02-05 DIAGNOSIS — E049 Nontoxic goiter, unspecified: Secondary | ICD-10-CM | POA: Diagnosis not present

## 2024-02-05 MED ORDER — SEMAGLUTIDE-WEIGHT MANAGEMENT 0.25 MG/0.5ML ~~LOC~~ SOAJ: 0.2500 mg | SUBCUTANEOUS | 1 refills | Status: AC

## 2024-02-05 MED ORDER — SEMAGLUTIDE-WEIGHT MANAGEMENT 0.5 MG/0.5ML ~~LOC~~ SOAJ: 0.5000 mg | SUBCUTANEOUS | 1 refills | Status: AC

## 2024-02-05 NOTE — Progress Notes (Unsigned)
 Established patient visit  Patient: Denise Gates   DOB: 13-Jul-1996   28 y.o. Female  MRN: [id number] Visit Date: 02/05/2024  Today's healthcare provider: Debera Lat, PA-C   Chief Complaint  Patient presents with   Follow-up    F/u on chronic disease. No concerns   Subjective     HPI     Follow-up    Additional comments: F/u on chronic disease. No concerns      Last edited by Liz Beach, CMA on 02/05/2024  2:00 PM.       Discussed the use of AI scribe software for clinical note transcription with the patient, who gave verbal consent to proceed.  History of Present Illness               02/05/2024    2:06 PM 11/06/2023    1:55 PM 09/26/2023    3:22 PM  Depression screen PHQ 2/9  Decreased Interest 3 0 3  Down, Depressed, Hopeless 2 0 1  PHQ - 2 Score 5 0 4  Altered sleeping 3 0 0  Tired, decreased energy 2 0 0  Change in appetite 2 3 3   Feeling bad or failure about yourself  3 2 0  Trouble concentrating 3 2 0  Moving slowly or fidgety/restless 3 0 0  Suicidal thoughts 0 0 0  PHQ-9 Score 21 7 7   Difficult doing work/chores Somewhat difficult Not difficult at all Somewhat difficult      02/05/2024    2:06 PM 11/06/2023    1:56 PM  GAD 7 : Generalized Anxiety Score  Nervous, Anxious, on Edge 3 0  Control/stop worrying 3 0  Worry too much - different things 3 1  Trouble relaxing 3 0  Restless 3 0  Easily annoyed or irritable 3 0  Afraid - awful might happen 3 0  Total GAD 7 Score 21 1  Anxiety Difficulty Somewhat difficult     Medications: No outpatient medications prior to visit.   No facility-administered medications prior to visit.    Review of Systems All negative Except see HPI   {Insert previous labs (optional):23779} {See past labs  Heme  Chem  Endocrine  Serology  Results Review (optional):1}   Objective    BP 107/74 (BP Location: Right Arm, Patient Position: Sitting)   Pulse 79   Temp (!) 97.5 F (36.4 C) (Oral)    Resp 16   Ht 5\' 7"  (1.702 m)   Wt 275 lb (124.7 kg)   SpO2 98%   BMI 43.07 kg/m  {Insert last BP/Wt (optional):23777}{See vitals history (optional):1}   Physical Exam   No results found for any visits on 02/05/24.      Assessment and Plan             No orders of the defined types were placed in this encounter.   No follow-ups on file.   The patient was advised to call back or seek an in-person evaluation if the symptoms worsen or if the condition fails to improve as anticipated.  I discussed the assessment and treatment plan with the patient. The patient was provided an opportunity to ask questions and all were answered. The patient agreed with the plan and demonstrated an understanding of the instructions.  I, Debera Lat, PA-C have reviewed all documentation for this visit. The documentation on 02/05/2024  for the exam, diagnosis, procedures, and orders are all accurate and complete.  Debera Lat, [facility], MMS [facility] [phone] (phone) [phone] (fax)  [facility]

## 2024-02-06 ENCOUNTER — Other Ambulatory Visit (HOSPITAL_COMMUNITY)
Admission: RE | Admit: 2024-02-06 | Discharge: 2024-02-06 | Disposition: A | Discharge status: Home / Self Care | Source: Ambulatory Visit | Attending: Obstetrics | Admitting: Obstetrics

## 2024-02-06 ENCOUNTER — Ambulatory Visit (INDEPENDENT_AMBULATORY_CARE_PROVIDER_SITE_OTHER): Admitting: Obstetrics

## 2024-02-06 VITALS — BP 143/75 | HR 101 | Ht 67.0 in | Wt 273.7 lb

## 2024-02-06 DIAGNOSIS — Z113 Encounter for screening for infections with a predominantly sexual mode of transmission: Secondary | ICD-10-CM

## 2024-02-06 DIAGNOSIS — N920 Excessive and frequent menstruation with regular cycle: Secondary | ICD-10-CM | POA: Diagnosis not present

## 2024-02-06 NOTE — Progress Notes (Signed)
   GYN ENCOUNTER  Subjective  HPI: Denise Gates is a 28 y.o. G0P0000 who presents today to discuss her menstrual bleeding and questions about PCOS.  Past Medical History:  Diagnosis Date   Breast disorder    lump on right breast   Past Surgical History:  Procedure Laterality Date   BREAST CYST EXCISION Right 07/26/2021   Procedure: CYST EXCISION BREAST, excision of breast fibroadenoma x 2;  Surgeon: Duanne Guess, MD;  Location: [facility] ORS;  Service: General;  Laterality: Right;   BREAST SURGERY  06/2021   NO PAST SURGERIES     OB History     Gravida  0   Para  0   Term  0   Preterm  0   AB  0   Living  0      SAB  0   IAB  0   Ectopic  0   Multiple  0   Live Births  0          No Known Allergies    Constitutional: Denied constitutional symptoms, night sweats, recent illness, fatigue, fever, insomnia and weight loss.  Eyes: Denied eye symptoms, eye pain, photophobia, vision change and visual disturbance.  Ears/Nose/Throat/Neck: Denied ear, nose, throat or neck symptoms, hearing loss, nasal discharge, sinus congestion and sore throat.  Cardiovascular: Denied cardiovascular symptoms, arrhythmia, chest pain/pressure, edema, exercise intolerance, orthopnea and palpitations.  Respiratory: Denied pulmonary symptoms, asthma, pleuritic pain, productive sputum, cough, dyspnea and wheezing.  Gastrointestinal: Denied, gastro-esophageal reflux, melena, nausea and vomiting.  Genitourinary: Denied genitourinary symptoms including symptomatic vaginal discharge, pelvic relaxation issues, and urinary complaints.  Musculoskeletal: Denied musculoskeletal symptoms, stiffness, swelling, muscle weakness and myalgia.  Dermatologic: Denied dermatology symptoms, rash and scar.  Neurologic: Denied neurology symptoms, dizziness, headache, neck pain and syncope.  Psychiatric: Denied psychiatric symptoms, anxiety and depression.  Endocrine: Denied endocrine symptoms including  hot flashes and night sweats.    Objective  BP (!) 143/75 (BP Location: Left Arm, Patient Position: Sitting, Cuff Size: Large)   Pulse (!) 101   Ht 5\' 7"  (1.702 m)   Wt 273 lb 11.2 oz (124.1 kg)   LMP 02/01/2024 (Exact Date)   BMI 42.87 kg/m   Physical examination Declines exam today. Would like to come back for Pap and PE.  Assessment -Heavy menstrual bleeding -Desires IUD -Desires HPV vaccination -Elevated BP reading  Plan -Discussed pathophysiology of PCOS and causes of intermenstrual bleeding/spotting. PCOS labs today at Lucita's request. Pelvic US ordered. -Desires IUD and Pap smear but does not want to do these today. Will schedule visit in May for physical, Pap, and IUD placement. -Gardasil out of stock in clinic today. Will schedule nurse visit. -F/u based on results -Jessice left before BP was repeated. Recommend PCP f/u.  Glenetta Borg, CNM

## 2024-02-08 ENCOUNTER — Telehealth: Payer: Self-pay

## 2024-02-08 ENCOUNTER — Encounter

## 2024-02-08 DIAGNOSIS — B379 Candidiasis, unspecified: Secondary | ICD-10-CM

## 2024-02-08 LAB — CERVICOVAGINAL ANCILLARY ONLY
Bacterial Vaginitis (gardnerella): NEGATIVE
Candida Glabrata: NEGATIVE
Candida Vaginitis: POSITIVE — AB
Chlamydia: NEGATIVE
Comment: NEGATIVE
Comment: NEGATIVE
Comment: NEGATIVE
Comment: NEGATIVE
Comment: NEGATIVE
Comment: NORMAL
Neisseria Gonorrhea: NEGATIVE
Trichomonas: NEGATIVE

## 2024-02-08 MED ORDER — FLUCONAZOLE 150 MG PO TABS: 150.0000 mg | ORAL_TABLET | Freq: Once | ORAL | 1 refills | Status: AC

## 2024-02-08 NOTE — Telephone Encounter (Signed)
 Patient advised she has seen swab results +yeast. Requesting treatment. Advised will send Diflucan.

## 2024-02-11 LAB — HEP, RPR, HIV PANEL
HIV Screen 4th Generation wRfx: NONREACTIVE
Hepatitis B Surface Ag: NEGATIVE
RPR Ser Ql: NONREACTIVE

## 2024-02-11 LAB — DHEA-SULFATE: DHEA-SO4: 330 ug/dL (ref 84.8–378.0)

## 2024-02-11 LAB — FSH/LH
FSH: 4.5 m[IU]/mL
LH: 6 m[IU]/mL

## 2024-02-11 LAB — PROLACTIN: Prolactin: 17 ng/mL (ref 4.8–33.4)

## 2024-02-11 LAB — TESTOSTERONE, FREE, TOTAL, SHBG
Sex Hormone Binding: 51.6 nmol/L (ref 24.6–122.0)
Testosterone, Free: 3.4 pg/mL (ref 0.0–4.2)
Testosterone: 53 ng/dL (ref 13–71)

## 2024-02-12 ENCOUNTER — Encounter: Payer: Self-pay | Admitting: Obstetrics

## 2024-02-12 ENCOUNTER — Ambulatory Visit
Admission: RE | Admit: 2024-02-12 | Discharge: 2024-02-12 | Disposition: A | Discharge status: Home / Self Care | Source: Ambulatory Visit | Attending: Physician Assistant | Admitting: Physician Assistant

## 2024-02-12 DIAGNOSIS — E049 Nontoxic goiter, unspecified: Secondary | ICD-10-CM | POA: Insufficient documentation

## 2024-02-19 ENCOUNTER — Ambulatory Visit: Payer: Self-pay

## 2024-02-19 ENCOUNTER — Encounter: Payer: Self-pay | Admitting: Physician Assistant

## 2024-02-19 NOTE — Telephone Encounter (Signed)
  Chief Complaint: imaging results/concerns Symptoms: N/A. Frequency: N/A. Pertinent Negatives: Patient denies N/A. Disposition: [] ED /[] Urgent Care (no appt availability in office) / [] Appointment(In office/virtual)/ []  [place] [facility]/ [] Home Care/ [] Refused Recommended Disposition /[] [place] [facility]/ [x]  Follow-up with PCP Additional Notes: Patient is concerned regarding thyroid ultrasound results that her PCP released to her. She states she does not feel comfortable going to get a thyroid biopsy without speaking to her PCP first. She states she is also upset because a year ago she mentioned her thyroid concerns and it was "not looked into". She states her thyroid was only being checked this time once she asked to be placed on Wegovy for weight loss. First available appt for PCP is not until 03/25/24. Patient would like her PCP to discuss via telephone or video visit if possible. Called [facility], spoke with Victorino Dike and she states a nurse will speak with PCP and get back to the patient, by EOB today if possible.  Copied from CRM [id number]. Topic: Clinical - Red Word Triage >> Feb 19, 2024  1:42 PM Denise Gates wrote: Red Word that prompted transfer to Nurse Triage: pt had a note hse needs a thyroid biopsy asap  pt would like to know more Reason for Disposition  [1] Follow-up call from patient regarding patient's clinical status AND [2] information NON-URGENT  Answer Assessment - Initial Assessment Questions 1. REASON FOR CALL or QUESTION: "What is your reason for calling today?" or "How can I best help you?" or "What question do you have that I can help answer?"     Patient was told by PCP to call in to set up an appointment to discuss her thyroid ultrasound results. She is upset and in tears, she states she does not want to get her biopsy done without speaking to the PCP first. She states she is very upset after the PCP released the US thyroid results to her. 2. CALLER: Document the source of  call. (e.g., laboratory, patient).     Patient.  Protocols used: PCP Call - No Triage-A-AH

## 2024-02-19 NOTE — Addendum Note (Signed)
 Addended by: Debera Lat on: 02/19/2024 12:58 AM   Modules accepted: Orders

## 2024-02-19 NOTE — Telephone Encounter (Signed)
 Nurse called and pt is upset about the message that was sent to her about her results.  There are no appts for this pt for a while.  Spoke with a CMA here and looks like this pt needs to be referred for biopsy and advised of her lab results.  Pt advised that  a CMA from the office will call her back after speaking with provider.

## 2024-02-21 ENCOUNTER — Telehealth: Payer: Self-pay

## 2024-02-21 NOTE — Telephone Encounter (Signed)
 Copied from CRM [id number]. Topic: Clinical - Lab/Test Results >> Feb 21, 2024  3:57 PM Denise Gates wrote: Reason for CRM: patient is calling because she wants clarification on her test results she feels like she is getting the run around and she was told that she had to get a biopsy but she do not want to get that until she has a clear understanding of her results please call patient back [phone]

## 2024-02-22 ENCOUNTER — Encounter: Payer: Self-pay | Admitting: Physician Assistant

## 2024-02-22 ENCOUNTER — Other Ambulatory Visit: Payer: Self-pay | Admitting: Physician Assistant

## 2024-02-22 DIAGNOSIS — E041 Nontoxic single thyroid nodule: Secondary | ICD-10-CM

## 2024-02-26 NOTE — Progress Notes (Signed)
 Patient for US guided FNA Mid Isthmus Thyroid Nodule Biopsy on Tues 02/27/24, I called and spoke with the patient on the phone and gave pre-procedure instructions. Pt was made aware to be here at 12:30p and check in at the [facility] registration desk. Pt stated understanding.  Called 02/22/24

## 2024-02-27 ENCOUNTER — Ambulatory Visit
Admission: RE | Admit: 2024-02-27 | Discharge: 2024-02-27 | Disposition: A | Discharge status: Home / Self Care | Source: Ambulatory Visit | Attending: Physician Assistant | Admitting: Physician Assistant

## 2024-02-27 DIAGNOSIS — E041 Nontoxic single thyroid nodule: Secondary | ICD-10-CM | POA: Diagnosis present

## 2024-02-27 MED ORDER — LIDOCAINE HCL (PF) 1 % IJ SOLN
9.0000 mL | Freq: Once | INTRAMUSCULAR | Status: AC
  Administered 2024-02-27: 9 mL
  Filled 2024-02-27: qty 10

## 2024-02-27 NOTE — Procedures (Signed)
 Successful US guided FNA of isthmus thyroid nodule. No complications. See PACS for full report.  Pattricia Boss D, PA-C 02/27/2024, 2:22 PM

## 2024-02-28 ENCOUNTER — Other Ambulatory Visit

## 2024-02-29 LAB — CYTOLOGY - NON PAP

## 2024-03-03 NOTE — Progress Notes (Deleted)
 Established patient visit  Patient: Denise Gates   DOB: 20-Dec-1995   28 y.o. Female  MRN: [id number] Visit Date: 03/04/2024  Today's healthcare provider: Debera Lat, PA-C   No chief complaint on file.  Subjective       Discussed the use of AI scribe software for clinical note transcription with the patient, who gave verbal consent to proceed.  History of Present Illness   important to monitor thyroid function and be aware of potential risks.  Semaglutide is contraindicated in patients with a personal or family history of medullary thyroid carcinoma (MTC) or multiple endocrine neoplasia syndrome type 2 (MEN 2).  There is no established causal relationship between semaglutide and benign thyroid nodules.  Routine monitoring of serum calcitonin or thyroid ultrasound is of uncertain value for early detection of thyroid tumors in patients treated with semaglutide.  Semaglutide may delay gastric emptying, which can affect the absorption of other oral medications, including thyroid hormones.  Patients should be counseled on the potential risk and symptoms of thyroid tumors, such as a mass in the neck, dysphagia, dyspnea, or persistent hoarseness. MEN II (Multiple Endocrine Neoplasia type II) symptoms include medullary thyroid carcinoma (MTC), pheochromocytoma, and hyperparathyroidism.  Medullary thyroid carcinoma (MTC) is often the first manifestation and can present with a neck mass, dysphagia, or hoarseness.  Pheochromocytoma symptoms include hypertension, palpitations, headache, and sweating.  Hyperparathyroidism can lead to hypercalcemia, causing symptoms like kidney stones, bone pain, and abdominal pain.  MEN II can also present with mucosal neuromas, marfanoid habitus, and gastrointestinal symptoms such as constipation and megacolon.  Early diagnosis and treatment are crucial to manage these conditions effectively.        02/05/2024    2:06 PM 11/06/2023    1:55 PM  09/26/2023    3:22 PM  Depression screen PHQ 2/9  Decreased Interest 3 0 3  Down, Depressed, Hopeless 2 0 1  PHQ - 2 Score 5 0 4  Altered sleeping 3 0 0  Tired, decreased energy 2 0 0  Change in appetite 2 3 3   Feeling bad or failure about yourself  3 2 0  Trouble concentrating 3 2 0  Moving slowly or fidgety/restless 3 0 0  Suicidal thoughts 0 0 0  PHQ-9 Score 21 7 7   Difficult doing work/chores Somewhat difficult Not difficult at all Somewhat difficult      02/05/2024    2:06 PM 11/06/2023    1:56 PM  GAD 7 : Generalized Anxiety Score  Nervous, Anxious, on Edge 3 0  Control/stop worrying 3 0  Worry too much - different things 3 1  Trouble relaxing 3 0  Restless 3 0  Easily annoyed or irritable 3 0  Afraid - awful might happen 3 0  Total GAD 7 Score 21 1  Anxiety Difficulty Somewhat difficult     Medications: Outpatient Medications Prior to Visit  Medication Sig   Semaglutide-Weight Management 0.25 MG/0.5ML SOAJ Inject 0.25 mg into the skin once a week for 28 days.   [START ON 03/05/2024] Semaglutide-Weight Management 0.5 MG/0.5ML SOAJ Inject 0.5 mg into the skin once a week for 28 days.   No facility-administered medications prior to visit.    Review of Systems All negative Except see HPI   {Insert previous labs (optional):23779} {See past labs  Heme  Chem  Endocrine  Serology  Results Review (optional):1}   Objective    LMP 02/01/2024 (Exact Date)  {Insert last BP/Wt (optional):23777}{See vitals history (optional):1}   Physical Exam  No results found for any visits on 03/04/24.      Assessment and Plan Assessment & Plan     No orders of the defined types were placed in this encounter.   No follow-ups on file.   The patient was advised to call back or seek an in-person evaluation if the symptoms worsen or if the condition fails to improve as anticipated.  I discussed the assessment and treatment plan with the patient. The patient was provided  an opportunity to ask questions and all were answered. The patient agreed with the plan and demonstrated an understanding of the instructions.  I, Debera Lat, PA-C have reviewed all documentation for this visit. The documentation on 03/04/2024  for the exam, diagnosis, procedures, and orders are all accurate and complete.  Debera Lat, [facility], MMS [facility] [phone] (phone) [phone] (fax)  [facility]

## 2024-03-04 ENCOUNTER — Inpatient Hospital Stay: Admitting: Physician Assistant

## 2024-03-04 DIAGNOSIS — E041 Nontoxic single thyroid nodule: Secondary | ICD-10-CM

## 2024-03-04 DIAGNOSIS — R030 Elevated blood-pressure reading, without diagnosis of hypertension: Secondary | ICD-10-CM

## 2024-03-11 ENCOUNTER — Inpatient Hospital Stay: Admitting: Physician Assistant

## 2024-03-18 ENCOUNTER — Other Ambulatory Visit (HOSPITAL_COMMUNITY): Payer: Self-pay

## 2024-03-18 ENCOUNTER — Telehealth: Payer: Self-pay

## 2024-03-18 ENCOUNTER — Ambulatory Visit (INDEPENDENT_AMBULATORY_CARE_PROVIDER_SITE_OTHER): Admitting: Physician Assistant

## 2024-03-18 ENCOUNTER — Encounter: Payer: Self-pay | Admitting: Physician Assistant

## 2024-03-18 MED ORDER — OZEMPIC (0.25 OR 0.5 MG/DOSE) 2 MG/3ML ~~LOC~~ SOPN: 0.2500 mg | PEN_INJECTOR | SUBCUTANEOUS | 1 refills | Status: DC

## 2024-03-18 MED ORDER — SEMAGLUTIDE-WEIGHT MANAGEMENT 0.25 MG/0.5ML ~~LOC~~ SOAJ: 0.2500 mg | SUBCUTANEOUS | 1 refills | Status: AC

## 2024-03-18 NOTE — Telephone Encounter (Signed)

## 2024-03-18 NOTE — Progress Notes (Signed)
 Established patient visit  Patient: Denise Gates   DOB: 23-May-1996   28 y.o. Female  MRN: [id number] Visit Date: 03/18/2024  Today's healthcare provider: Blane Bunting, PA-C   Chief Complaint  Patient presents with   Follow-up    6 wk f/u obesity and labs . Meds   Subjective     Discussed the use of AI scribe software for clinical note transcription with the patient, who gave verbal consent to proceed.  History of Present Illness The patient, with a benign follicular nodule, is seeking to manage her weight and has expressed interest in using Wegovy . She understands that weight loss will be a gradual process and has been making efforts to lose weight through exercise and diet, including eating salads. She has noticed some weight loss but is seeking a 'booster' to aid her efforts. The patient is aware of the potential side effects of Wegovy , including gastrointestinal issues, and is prepared to monitor for any changes, particularly in relation to her nodule. She has missed an appointment with a nutritionist but is keen to reschedule and understand the importance of a suitable diet in her weight loss journey.       03/18/2024    1:42 PM 02/05/2024    2:06 PM 11/06/2023    1:55 PM  Depression screen PHQ 2/9  Decreased Interest 0 3 0  Down, Depressed, Hopeless 0 2 0  PHQ - 2 Score 0 5 0  Altered sleeping 0 3 0  Tired, decreased energy 0 2 0  Change in appetite 0 2 3  Feeling bad or failure about yourself  0 3 2  Trouble concentrating 0 3 2  Moving slowly or fidgety/restless 0 3 0  Suicidal thoughts 0 0 0  PHQ-9 Score 0 21 7  Difficult doing work/chores Not difficult at all Somewhat difficult Not difficult at all      03/18/2024    1:42 PM 02/05/2024    2:06 PM 11/06/2023    1:56 PM  GAD 7 : Generalized Anxiety Score  Nervous, Anxious, on Edge 0 3 0  Control/stop worrying 0 3 0  Worry too much - different things 0 3 1  Trouble relaxing 0 3 0  Restless 0 3 0  Easily  annoyed or irritable 0 3 0  Afraid - awful might happen 0 3 0  Total GAD 7 Score 0 21 1  Anxiety Difficulty Not difficult at all Somewhat difficult     Medications: Outpatient Medications Prior to Visit  Medication Sig   Semaglutide -Weight Management 0.5 MG/0.5ML SOAJ Inject 0.5 mg into the skin once a week for 28 days. (Patient not taking: Reported on 03/18/2024)   No facility-administered medications prior to visit.    Review of Systems All negative Except see HPI       Objective    BP 103/70 (BP Location: Left Arm, Patient Position: Sitting, Cuff Size: Large)   Pulse 95   Resp 16   Ht 5\' 7"  (1.702 m)   Wt 280 lb 1.6 oz (127.1 kg)   SpO2 97%   BMI 43.87 kg/m     Physical Exam Vitals reviewed.  Constitutional:      General: She is not in acute distress.    Appearance: Normal appearance. She is well-developed. She is not diaphoretic.  HENT:     Head: Normocephalic and atraumatic.  Eyes:     General: No scleral icterus.    Conjunctiva/sclera: Conjunctivae normal.  Neck:     Thyroid : No thyromegaly.  Cardiovascular:     Rate and Rhythm: Normal rate and regular rhythm.     Pulses: Normal pulses.     Heart sounds: Normal heart sounds. No murmur heard. Pulmonary:     Effort: Pulmonary effort is normal. No respiratory distress.     Breath sounds: Normal breath sounds. No wheezing, rhonchi or rales.  Musculoskeletal:     Cervical back: Neck supple.     Right lower leg: No edema.     Left lower leg: No edema.  Lymphadenopathy:     Cervical: No cervical adenopathy.  Skin:    General: Skin is warm and dry.     Findings: No rash.  Neurological:     Mental Status: She is alert and oriented to person, place, and time. Mental status is at baseline.  Psychiatric:        Mood and Affect: Mood normal.        Behavior: Behavior normal.      No results found for any visits on 03/18/24.      Assessment & Plan Benign follicular thyroid  nodule/Morbid obesity Benign  nodule allows Wegovy  initiation for weight management. Emphasized monitoring for complications and gastrointestinal side effects. Recommended high-protein, low-carb diet. Informed consent obtained for Wegovy  with dosage guidance. - Initiate Wegovy  at 0.25 mg weekly. - Monitor for symptoms such as neck tightness, dyspnea, dysphagia, and hoarseness. - Schedule follow-up in two months. - Consult American Diabetic Association website for dietary guidance. - Attempt to schedule an appointment with a nutritionist. - Contact provider if experiencing any unusual symptoms or side effects.  Hyperlipidemia Previous lipid panel showed abnormalities. Plan to repeat lipid panel to monitor condition. - Repeat lipid panel during next visit.   No orders of the defined types were placed in this encounter.   Return in about 2 months (around 05/18/2024) for chronic disease f/u.   The patient was advised to call back or seek an in-person evaluation if the symptoms worsen or if the condition fails to improve as anticipated.  I discussed the assessment and treatment plan with the patient. The patient was provided an opportunity to ask questions and all were answered. The patient agreed with the plan and demonstrated an understanding of the instructions.  I, Kenlynn Houde, PA-C have reviewed all documentation for this visit. The documentation on 03/18/2024  for the exam, diagnosis, procedures, and orders are all accurate and complete.  Blane Bunting, [facility], MMS [facility] [phone] (phone) [phone] (fax)  [facility]

## 2024-03-19 ENCOUNTER — Telehealth: Payer: Self-pay

## 2024-03-19 ENCOUNTER — Other Ambulatory Visit (HOSPITAL_COMMUNITY): Payer: Self-pay

## 2024-03-19 NOTE — Telephone Encounter (Signed)
 Pharmacy Patient Advocate Encounter   Received notification from Physician's Office that prior authorization for Wegovy  0.25MG /0.5ML auto-injectors is required/requested.   Insurance verification completed.   The patient is insured through [facility] [place] .   Per test claim: PA required; PA submitted to above mentioned insurance via CoverMyMeds Key/confirmation #/EOC [id number] Status is pending

## 2024-03-19 NOTE — Telephone Encounter (Signed)
 Sorry about that, I did not see the request for that but I will complete the PA for the Wegovy  today. Sorry for any inconvenience.

## 2024-03-19 NOTE — Telephone Encounter (Signed)
 Pharmacy Patient Advocate Encounter  Received notification from [facility] Medicaid that Prior Authorization for Wegovy  0.25MG /0.5ML auto-injectors has been APPROVED from 03/19/24 to 09/15/24. Ran test claim, Copay is $4. This test claim was processed through [facility]- copay amounts may vary at other pharmacies due to pharmacy/plan contracts, or as the patient moves through the different stages of their insurance plan.   PA #/Case ID/Reference #: [id number]

## 2024-04-03 ENCOUNTER — Other Ambulatory Visit

## 2024-04-26 ENCOUNTER — Ambulatory Visit: Admission: RE | Admit: 2024-04-26 | Source: Ambulatory Visit

## 2024-05-01 ENCOUNTER — Encounter: Payer: Self-pay | Admitting: Obstetrics

## 2024-05-20 ENCOUNTER — Ambulatory Visit: Admitting: Physician Assistant

## 2024-05-20 NOTE — Progress Notes (Deleted)
 Established patient visit  Patient: Denise Gates   DOB: [birth year]   28 y.o. Female  MRN: [id number] Visit Date: 05/20/2024  Today's healthcare provider: Jolynn Spencer, PA-C   No chief complaint on file.  Subjective       Discussed the use of AI scribe software for clinical note transcription with the patient, who gave verbal consent to proceed.  History of Present Illness        03/18/2024    1:42 PM 02/05/2024    2:06 PM 11/06/2023    1:55 PM  Depression screen PHQ 2/9  Decreased Interest 0 3 0  Down, Depressed, Hopeless 0 2 0  PHQ - 2 Score 0 5 0  Altered sleeping 0 3 0  Tired, decreased energy 0 2 0  Change in appetite 0 2 3  Feeling bad or failure about yourself  0 3 2  Trouble concentrating 0 3 2  Moving slowly or fidgety/restless 0 3 0  Suicidal thoughts 0 0 0  PHQ-9 Score 0 21 7  Difficult doing work/chores Not difficult at all Somewhat difficult Not difficult at all      03/18/2024    1:42 PM 02/05/2024    2:06 PM 11/06/2023    1:56 PM  GAD 7 : Generalized Anxiety Score  Nervous, Anxious, on Edge 0 3 0  Control/stop worrying 0 3 0  Worry too much - different things 0 3 1  Trouble relaxing 0 3 0  Restless 0 3 0  Easily annoyed or irritable 0 3 0  Afraid - awful might happen 0 3 0  Total GAD 7 Score 0 21 1  Anxiety Difficulty Not difficult at all Somewhat difficult     Medications: No outpatient medications prior to visit.   No facility-administered medications prior to visit.    Review of Systems  All other systems reviewed and are negative. All negative Except see HPI   {Insert previous labs (optional):23779} {See past labs  Heme  Chem  Endocrine  Serology  Results Review (optional):1}   Objective    There were no vitals taken for this visit. {Insert last BP/Wt (optional):23777}{See vitals history (optional):1}   Physical Exam Vitals reviewed.  Constitutional:      General: She is not in acute distress.    Appearance:  Normal appearance. She is well-developed. She is not diaphoretic.  HENT:     Head: Normocephalic and atraumatic.   Eyes:     General: No scleral icterus.    Conjunctiva/sclera: Conjunctivae normal.   Neck:     Thyroid : No thyromegaly.   Cardiovascular:     Rate and Rhythm: Normal rate and regular rhythm.     Pulses: Normal pulses.     Heart sounds: Normal heart sounds. No murmur heard. Pulmonary:     Effort: Pulmonary effort is normal. No respiratory distress.     Breath sounds: Normal breath sounds. No wheezing, rhonchi or rales.   Musculoskeletal:     Cervical back: Neck supple.     Right lower leg: No edema.     Left lower leg: No edema.  Lymphadenopathy:     Cervical: No cervical adenopathy.   Skin:    General: Skin is warm and dry.     Findings: No rash.   Neurological:     Mental Status: She is alert and oriented to person, place, and time. Mental status is at baseline.   Psychiatric:        Mood and Affect: Mood normal.  Behavior: Behavior normal.     No results found for any visits on 05/20/24.      Assessment and Plan Assessment & Plan     No orders of the defined types were placed in this encounter.   No follow-ups on file.   The patient was advised to call back or seek an in-person evaluation if the symptoms worsen or if the condition fails to improve as anticipated.  I discussed the assessment and treatment plan with the patient. The patient was provided an opportunity to ask questions and all were answered. The patient agreed with the plan and demonstrated an understanding of the instructions.  I, Misty Rago, PA-C have reviewed all documentation for this visit. The documentation on 05/20/2024  for the exam, diagnosis, procedures, and orders are all accurate and complete.  Jolynn Spencer, [facility], MMS [facility] [phone] (phone) [phone] (fax)  [facility]

## 2024-07-05 ENCOUNTER — Telehealth: Payer: Self-pay | Admitting: Obstetrics

## 2024-07-05 ENCOUNTER — Other Ambulatory Visit

## 2024-07-05 ENCOUNTER — Other Ambulatory Visit: Payer: Self-pay

## 2024-07-05 DIAGNOSIS — N920 Excessive and frequent menstruation with regular cycle: Secondary | ICD-10-CM

## 2024-07-05 NOTE — Progress Notes (Signed)
 Patient no show for prior scheduled u/s. Therefore order was marked do not schedule. New order placed for pelvis u/s scheduled today.

## 2024-07-05 NOTE — Telephone Encounter (Signed)
 Reached out to pt to reschedule pelvic US  that was scheduled on 07/05/2024 at 9:15 per EMERSON Canny.  Could not leave a message bc voice mail was not set up.

## 2024-07-09 ENCOUNTER — Encounter: Payer: Self-pay | Admitting: Obstetrics

## 2024-07-09 NOTE — Telephone Encounter (Signed)
 Reached out to pt (2x) to reschedule pelvic US  that was scheduled on 07/05/2024 at 9:15 per EMERSON Canny.  Could not leave a message bc voicemail was not set up.  Will send  a MyChart letter to pt.

## 2024-07-16 NOTE — Progress Notes (Unsigned)
 Established patient visit  Patient: Denise Gates   DOB: 08-20-1996   28 y.o. Female  MRN: [id number] Visit Date: 07/17/2024  Today's healthcare provider: Jolynn Spencer, PA-C   No chief complaint on file.  Subjective       Discussed the use of AI scribe software for clinical note transcription with the patient, who gave verbal consent to proceed.  History of Present Illness        03/18/2024    1:42 PM 02/05/2024    2:06 PM 11/06/2023    1:55 PM  Depression screen PHQ 2/9  Decreased Interest 0 3 0  Down, Depressed, Hopeless 0 2 0  PHQ - 2 Score 0 5 0  Altered sleeping 0 3 0  Tired, decreased energy 0 2 0  Change in appetite 0 2 3  Feeling bad or failure about yourself  0 3 2  Trouble concentrating 0 3 2  Moving slowly or fidgety/restless 0 3 0  Suicidal thoughts 0 0 0  PHQ-9 Score 0 21 7  Difficult doing work/chores Not difficult at all Somewhat difficult Not difficult at all      03/18/2024    1:42 PM 02/05/2024    2:06 PM 11/06/2023    1:56 PM  GAD 7 : Generalized Anxiety Score  Nervous, Anxious, on Edge 0 3 0  Control/stop worrying 0 3 0  Worry too much - different things 0 3 1  Trouble relaxing 0 3 0  Restless 0 3 0  Easily annoyed or irritable 0 3 0  Afraid - awful might happen 0 3 0  Total GAD 7 Score 0 21 1  Anxiety Difficulty Not difficult at all Somewhat difficult     Medications: No outpatient medications prior to visit.   No facility-administered medications prior to visit.    Review of Systems  All other systems reviewed and are negative.  All negative Except see HPI   {Insert previous labs (optional):23779} {See past labs  Heme  Chem  Endocrine  Serology  Results Review (optional):1}   Objective    There were no vitals taken for this visit. {Insert last BP/Wt (optional):23777}{See vitals history (optional):1}   Physical Exam Vitals reviewed.  Constitutional:      General: She is not in acute distress.    Appearance:  Normal appearance. She is well-developed. She is not diaphoretic.  HENT:     Head: Normocephalic and atraumatic.  Eyes:     General: No scleral icterus.    Conjunctiva/sclera: Conjunctivae normal.  Neck:     Thyroid : No thyromegaly.  Cardiovascular:     Rate and Rhythm: Normal rate and regular rhythm.     Pulses: Normal pulses.     Heart sounds: Normal heart sounds. No murmur heard. Pulmonary:     Effort: Pulmonary effort is normal. No respiratory distress.     Breath sounds: Normal breath sounds. No wheezing, rhonchi or rales.  Musculoskeletal:     Cervical back: Neck supple.     Right lower leg: No edema.     Left lower leg: No edema.  Lymphadenopathy:     Cervical: No cervical adenopathy.  Skin:    General: Skin is warm and dry.     Findings: No rash.  Neurological:     Mental Status: She is alert and oriented to person, place, and time. Mental status is at baseline.  Psychiatric:        Mood and Affect: Mood normal.  Behavior: Behavior normal.     No results found for any visits on 07/17/24.      Assessment and Plan Assessment & Plan     No orders of the defined types were placed in this encounter.   No follow-ups on file.   The patient was advised to call back or seek an in-person evaluation if the symptoms worsen or if the condition fails to improve as anticipated.  I discussed the assessment and treatment plan with the patient. The patient was provided an opportunity to ask questions and all were answered. The patient agreed with the plan and demonstrated an understanding of the instructions.  I, Edword Cu, PA-C have reviewed all documentation for this visit. The documentation on 07/17/2024  for the exam, diagnosis, procedures, and orders are all accurate and complete.  Jolynn Spencer, [facility], MMS [facility] [phone] (phone) [phone] (fax)  [facility]

## 2024-07-17 ENCOUNTER — Ambulatory Visit: Admitting: Physician Assistant

## 2024-07-17 ENCOUNTER — Encounter: Payer: Self-pay | Admitting: Physician Assistant

## 2024-07-17 DIAGNOSIS — E041 Nontoxic single thyroid nodule: Secondary | ICD-10-CM

## 2024-07-17 DIAGNOSIS — E049 Nontoxic goiter, unspecified: Secondary | ICD-10-CM

## 2024-07-17 DIAGNOSIS — E282 Polycystic ovarian syndrome: Secondary | ICD-10-CM | POA: Diagnosis not present

## 2024-07-17 DIAGNOSIS — E7800 Pure hypercholesterolemia, unspecified: Secondary | ICD-10-CM

## 2024-07-17 MED ORDER — WEGOVY 0.5 MG/0.5ML ~~LOC~~ SOAJ: 0.5000 mg | SUBCUTANEOUS | 1 refills | Status: AC

## 2024-07-18 ENCOUNTER — Ambulatory Visit: Payer: Self-pay | Admitting: Physician Assistant

## 2024-07-18 LAB — LIPID PANEL
Chol/HDL Ratio: 3.9 ratio (ref 0.0–4.4)
Cholesterol, Total: 148 mg/dL (ref 100–199)
HDL: 38 mg/dL — ABNORMAL LOW (ref >39)
LDL Chol Calc (NIH): 96 mg/dL (ref 0–99)
Triglycerides: 71 mg/dL (ref 0–149)
VLDL Cholesterol Cal: 14 mg/dL (ref 5–40)

## 2024-07-18 LAB — TSH: TSH: 1.19 u[IU]/mL (ref 0.450–4.500)

## 2024-07-18 LAB — COMPREHENSIVE METABOLIC PANEL WITH GFR
ALT: 21 IU/L (ref 0–32)
AST: 14 IU/L (ref 0–40)
Albumin: 4.1 g/dL (ref 4.0–5.0)
Alkaline Phosphatase: 73 IU/L (ref 44–121)
BUN/Creatinine Ratio: 18 (ref 9–23)
BUN: 13 mg/dL (ref 6–20)
Bilirubin Total: 0.3 mg/dL (ref 0.0–1.2)
CO2: 23 mmol/L (ref 20–29)
Calcium: 9.5 mg/dL (ref 8.7–10.2)
Chloride: 105 mmol/L (ref 96–106)
Creatinine, Ser: 0.74 mg/dL (ref 0.57–1.00)
Globulin, Total: 3 g/dL (ref 1.5–4.5)
Glucose: 98 mg/dL (ref 70–99)
Potassium: 4.7 mmol/L (ref 3.5–5.2)
Sodium: 141 mmol/L (ref 134–144)
Total Protein: 7.1 g/dL (ref 6.0–8.5)
eGFR: 114 mL/min/1.73 (ref >59)

## 2024-07-18 LAB — HEMOGLOBIN A1C
Est. average glucose Bld gHb Est-mCnc: 114 mg/dL
Hgb A1c MFr Bld: 5.6 % (ref 4.8–5.6)

## 2024-08-01 ENCOUNTER — Ambulatory Visit: Payer: Medicaid Other | Admitting: Dermatology

## 2024-08-19 ENCOUNTER — Ambulatory Visit: Admitting: Dermatology

## 2024-09-09 ENCOUNTER — Ambulatory Visit

## 2024-09-11 ENCOUNTER — Ambulatory Visit: Admitting: Physician Assistant

## 2024-09-24 ENCOUNTER — Ambulatory Visit

## 2024-09-27 ENCOUNTER — Other Ambulatory Visit (HOSPITAL_COMMUNITY): Payer: Self-pay

## 2024-09-27 ENCOUNTER — Telehealth: Payer: Self-pay | Admitting: Pharmacy Technician

## 2024-09-27 NOTE — Telephone Encounter (Signed)
 Pharmacy Patient Advocate Encounter   Received notification from Onbase that prior authorization for Wegovy  0.5MG /0.5ML auto-injectors is required/requested.   Insurance verification completed.   The patient is insured through [facility] MEDICAID.   Per test claim: Effective October 1st, Medicaid discontinued coverage of GLP1 medications for weight loss (such as Wegovy  and Zepbound), unless the patient has a documented history of a heart attack or stroke. Zepbound will continue to be covered only for patients with moderate to severe sleep apnea (AHI 15-30) and a BMI greater than 40. Because of this change, the prior authorization team will not be submitting new PA requests for GLP1 medications prescribed for weight loss, as patients will be unable to continue therapy under Medicaid coverage.

## 2024-10-09 NOTE — Progress Notes (Deleted)
   GYN ENCOUNTER  Subjective  HPI: Denise Gates is a 28 y.o. G0P0000 who presents today for ***  Past Medical History:  Diagnosis Date   Breast disorder    lump on right breast   Past Surgical History:  Procedure Laterality Date   BREAST CYST EXCISION Right 07/26/2021   Procedure: CYST EXCISION BREAST, excision of breast fibroadenoma x 2;  Surgeon: Marolyn Nest, MD;  Location: [facility] ORS;  Service: General;  Laterality: Right;   BREAST SURGERY  06/2021   NO PAST SURGERIES     OB History     Gravida  0   Para  0   Term  0   Preterm  0   AB  0   Living  0      SAB  0   IAB  0   Ectopic  0   Multiple  0   Live Births  0          No Known Allergies  Constitutional: Denied constitutional symptoms, night sweats, recent illness, fatigue, fever, insomnia and weight loss.  Eyes: Denied eye symptoms, eye pain, photophobia, vision change and visual disturbance.  Ears/Nose/Throat/Neck: Denied ear, nose, throat or neck symptoms, hearing loss, nasal discharge, sinus congestion and sore throat.  Cardiovascular: Denied cardiovascular symptoms, arrhythmia, chest pain/pressure, edema, exercise intolerance, orthopnea and palpitations.  Respiratory: Denied pulmonary symptoms, asthma, pleuritic pain, productive sputum, cough, dyspnea and wheezing.  Gastrointestinal: Denied, gastro-esophageal reflux, melena, nausea and vomiting.  Genitourinary:*** Denied genitourinary symptoms including symptomatic vaginal discharge, pelvic relaxation issues, and urinary complaints.  Musculoskeletal: Denied musculoskeletal symptoms, stiffness, swelling, muscle weakness and myalgia.  Dermatologic: Denied dermatology symptoms, rash and scar.  Neurologic: Denied neurology symptoms, dizziness, headache, neck pain and syncope.  Psychiatric: Denied psychiatric symptoms, anxiety and depression.  Endocrine: Denied endocrine symptoms including hot flashes and night sweats.     Objective  There were no vitals taken for this visit.  Physical examination   Pelvic:   Vulva: Normal appearance.  No lesions.  Vagina: No lesions or abnormalities noted.  Support: Normal pelvic support.  Urethra No masses tenderness or scarring.  Meatus Normal size without lesions or prolapse.  Cervix: Normal appearance.  No lesions.  Anus: Normal exam.  No lesions.  Perineum: Normal exam.  No lesions.        Bimanual   Uterus: Normal size.  Non-tender.  Mobile.  AV.  Adnexae: No masses.  Non-tender to palpation.  Cul-de-sac: Negative for abnormality.    Assessment   Plan    Eleanor Canny, CNM

## 2024-10-11 ENCOUNTER — Ambulatory Visit: Admitting: Physician Assistant

## 2024-10-14 ENCOUNTER — Ambulatory Visit: Admitting: Obstetrics

## 2024-10-22 ENCOUNTER — Ambulatory Visit (INDEPENDENT_AMBULATORY_CARE_PROVIDER_SITE_OTHER)

## 2024-10-22 DIAGNOSIS — L989 Disorder of the skin and subcutaneous tissue, unspecified: Secondary | ICD-10-CM

## 2024-10-22 DIAGNOSIS — L700 Acne vulgaris: Secondary | ICD-10-CM | POA: Diagnosis not present

## 2024-10-22 DIAGNOSIS — L988 Other specified disorders of the skin and subcutaneous tissue: Secondary | ICD-10-CM

## 2024-10-22 MED ORDER — TRETINOIN 0.025 % EX CREA: TOPICAL_CREAM | Freq: Every day | CUTANEOUS | 5 refills | Status: DC

## 2024-10-22 NOTE — Patient Instructions (Signed)

## 2024-10-22 NOTE — Progress Notes (Signed)
    Subjective   Denise Gates is a 28 y.o. female who presents for the following: Acne and darker discoloration on the neck. Patient is new patient  Today patient reports: Pt has struggled with acne for years and would like to discuss tretinoin . Pt c/o darker discoloration on neck that started a few years ago. She has been testing for diabetes/prediabetes, but has also tested negative. She would like to discuss tx options.  Review of Systems:    No other skin or systemic complaints except as noted in HPI or Assessment and Plan.  The following portions of the chart were reviewed this encounter and updated as appropriate: medications, allergies, medical history  Relevant Medical History:  n/a   Objective  (SKPE) Well appearing patient in no apparent distress; mood and affect are within normal limits. 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 Clean areas of neck once weekly with ETOH. - follows with PCP, A1c wnl 06/2024   Acne vulgaris - moderate - Chronic and persistent condition with duration or expected duration over one year. Condition is symptomatic and bothersome to patient. Patient is flaring and not currently at treatment goal.  - Discussed various treatment options with patient, as well as need for consistent use for at least 6-12 weeks for full efficacy.  - Reviewed treatment options, including side effects of topical agents, oral antibiotics, OCPs (if female), oral spironolactone (if female), and isotretinoin. Discussed that isotretinoin is the most effective  - After discussion opted to initiate:    - Tretinoin   0.025% cream qhs as tolerated - Explained this will cause irritation, dryness, and peeling with initial use, therefore start every 3 nights and increase frequency slowly   - Continue OTC Panoxyl wash.    - Recommend Bubble tinted sunscreen.  Patient instructions (SKPI)   Procedures, orders, diagnosis for this visit:    There are no diagnoses linked to this encounter.  Return to clinic: Return in about 3 months (around 01/22/2025) for acne and rash follow up.  LILLETTE Rosina Mayans, CMA, am acting as scribe for Lauraine JAYSON Kanaris, MD .   Documentation: I have reviewed the above documentation for accuracy and completeness, and I agree with the above.  Lauraine JAYSON Kanaris, MD

## 2024-10-23 ENCOUNTER — Telehealth: Payer: Self-pay

## 2024-10-23 MED ORDER — ADAPALENE 0.3 % EX GEL: CUTANEOUS | 5 refills | Status: AC

## 2024-10-23 NOTE — Telephone Encounter (Signed)
 Tretinoin  not covered by insurance; per Dr. Raymund Adapalene  sent to pharmacy on file. Patient informed via MyChart.

## 2024-10-29 ENCOUNTER — Other Ambulatory Visit (HOSPITAL_COMMUNITY): Payer: Self-pay

## 2024-11-01 ENCOUNTER — Other Ambulatory Visit (HOSPITAL_COMMUNITY): Payer: Self-pay

## 2025-01-29 ENCOUNTER — Ambulatory Visit

## 2025-02-05 ENCOUNTER — Ambulatory Visit: Admitting: Obstetrics
